# Patient Record
Sex: Female | Born: 1985 | Race: White | Hispanic: No | Marital: Single | Smoking: Never smoker
Health system: Southern US, Community
[De-identification: ages and names within clinical notes are randomized; demographics above are authoritative.]

## PROBLEM LIST (undated history)

## (undated) DIAGNOSIS — Q858 Other phakomatoses, not elsewhere classified: Secondary | ICD-10-CM

## (undated) DIAGNOSIS — Q8589 Other phakomatoses, not elsewhere classified: Secondary | ICD-10-CM

## (undated) DIAGNOSIS — K635 Polyp of colon: Secondary | ICD-10-CM

## (undated) DIAGNOSIS — C801 Malignant (primary) neoplasm, unspecified: Secondary | ICD-10-CM

## (undated) DIAGNOSIS — C50919 Malignant neoplasm of unspecified site of unspecified female breast: Secondary | ICD-10-CM

## (undated) HISTORY — DX: Malignant neoplasm of unspecified site of unspecified female breast: C50.919

## (undated) HISTORY — DX: Polyp of colon: K63.5

## (undated) HISTORY — PX: ABDOMINAL HYSTERECTOMY: SHX81

## (undated) HISTORY — PX: ABDOMINAL SURGERY: SHX537

## (undated) HISTORY — PX: LAPAROSCOPIC OOPHERECTOMY: SHX6507

## (undated) HISTORY — PX: MASTECTOMY: SHX3

---

## 2014-10-01 ENCOUNTER — Emergency Department
Admission: EM | Admit: 2014-10-01 | Discharge: 2014-10-01 | Disposition: A | Discharge status: Home / Self Care | Payer: Self-pay | Source: Home / Self Care | Attending: Emergency Medicine | Admitting: Emergency Medicine

## 2014-10-01 ENCOUNTER — Encounter: Payer: Self-pay | Admitting: *Deleted

## 2014-10-01 DIAGNOSIS — J0100 Acute maxillary sinusitis, unspecified: Secondary | ICD-10-CM

## 2014-10-01 DIAGNOSIS — J209 Acute bronchitis, unspecified: Secondary | ICD-10-CM

## 2014-10-01 HISTORY — DX: Other phakomatoses, not elsewhere classified: Q85.8

## 2014-10-01 HISTORY — DX: Other phakomatoses, not elsewhere classified: Q85.89

## 2014-10-01 MED ORDER — AZITHROMYCIN 250 MG PO TABS: ORAL_TABLET | ORAL | Status: DC

## 2014-10-01 MED ORDER — PROMETHAZINE-CODEINE 6.25-10 MG/5ML PO SYRP: ORAL_SOLUTION | ORAL | Status: DC

## 2014-10-01 MED ORDER — PREDNISONE 20 MG PO TABS: ORAL_TABLET | ORAL | Status: DC

## 2014-10-01 MED ORDER — ALBUTEROL SULFATE HFA 108 (90 BASE) MCG/ACT IN AERS: 2.0000 | INHALATION_SPRAY | RESPIRATORY_TRACT | Status: DC | PRN

## 2014-10-01 NOTE — ED Notes (Signed)
Pt c/o productive cough, nasal congestion, bilateral ear ache and swollen glands in neck x 1 wk. Denies fever. She has taken dayquil and nyquil with some relief.

## 2014-10-01 NOTE — ED Provider Notes (Signed)
CSN: [id number]     Arrival date & time 10/01/14  1729 History   First MD Initiated Contact with Patient 10/01/14 1801     Chief Complaint  Patient presents with  . Cough  . Nasal Congestion   (Consider location/radiation/quality/duration/timing/severity/associated sxs/prior Treatment) HPI Pt c/o productive cough, nasal congestion, bilateral ear ache and swollen glands in neck 9 days. Denies fever. She has taken dayquil and nyquil with some relief.--Chronic meds are Flonase and Zyrtec.  She tends to get URI/bronchitis with the season changes once a year, but is particularly worse this year. Denies history of asthma but had some type of bronchospasm with when necessary inhaler as a child. Other symptoms include low-grade fever, chills and sweats, sinus congestion discolored rhinorrhea, sore throat, hoarseness hacking nonproductive cough that keeps her up at night. Occasional wheezing at night. Occasional wheezing when she is outside in cold air. Past Medical History  Diagnosis Date  . Peutz-Jeghers syndrome    Past Surgical History  Procedure Laterality Date  . Abdominal surgery     Family History  Problem Relation Age of Onset  . Hyperlipidemia Father    History  Substance Use Topics  . Smoking status: Never Smoker   . Smokeless tobacco: Not on file  . Alcohol Use: No   OB History    No data available     Review of Systems  All other systems reviewed and are negative.  LMP 2 weeks ago  Allergies  Aspirin  Home Medications   Prior to Admission medications   Medication Sig Start Date End Date Taking? Authorizing Provider  fluticasone (FLONASE) 50 MCG/ACT nasal spray Place into both nostrils daily.   Yes Historical Provider, MD  ibuprofen (ADVIL,MOTRIN) 200 MG tablet Take 200 mg by mouth every 6 (six) hours as needed.   Yes Historical Provider, MD  albuterol (PROVENTIL HFA;VENTOLIN HFA) 108 (90 BASE) MCG/ACT inhaler Inhale 2 puffs into the lungs every 4 (four) hours  as needed for wheezing or shortness of breath. 10/01/14   Jacqulyn Cane, MD  azithromycin (ZITHROMAX Z-PAK) 250 MG tablet Take 2 tablets on day one, then 1 tablet daily on days 2 through 5 10/01/14   Jacqulyn Cane, MD  predniSONE (DELTASONE) 20 MG tablet Take 3 by mouth today with food, then 1 twice a day with food for 6 more days 10/01/14   Jacqulyn Cane, MD  promethazine-codeine [facility] WITH CODEINE) 6.25-10 MG/5ML syrup Take 1-2 teaspoons before bedtime as needed for cough. May cause drowsiness. 10/01/14   Jacqulyn Cane, MD   BP 119/79 mmHg  Pulse 91  Temp(Src) 98.2 F (36.8 C) (Oral)  Resp 16  Ht 5\' 5"  (1.651 m)  Wt 190 lb (86.183 kg)  BMI 31.62 kg/m2  SpO2 99%  LMP 09/17/2014 Physical Exam  Constitutional: She is oriented to person, place, and time. She appears well-developed and well-nourished. No distress.  Occasional hacking cough noted  HENT:  Head: Normocephalic and atraumatic.  Right Ear: Tympanic membrane, external ear and ear canal normal.  Left Ear: Tympanic membrane, external ear and ear canal normal.  Nose: Mucosal edema and rhinorrhea present. Right sinus exhibits maxillary sinus tenderness. Left sinus exhibits maxillary sinus tenderness.  Mouth/Throat: Oropharynx is clear and moist. No oral lesions. No oropharyngeal exudate.  Eyes: Right eye exhibits no discharge. Left eye exhibits no discharge. No scleral icterus.  Neck: Neck supple.  Cardiovascular: Normal rate, regular rhythm and normal heart sounds.   Pulmonary/Chest: Effort normal. She has wheezes (minimal late expiratory wheezes  anteriorly. Good air movement bilaterally). She has rhonchi. She has no rales.  Lymphadenopathy:    She has no cervical adenopathy.  Neurological: She is alert and oriented to person, place, and time.  Skin: Skin is warm and dry.  Psychiatric: She has a normal mood and affect.  Nursing note and vitals reviewed.   ED Course  Procedures (including critical care time) Labs  Review Labs Reviewed - No data to display  Imaging Review No results found.   MDM   1. Acute bronchitis, unspecified organism   2. Acute maxillary sinusitis, recurrence not specified    Treatment options discussed, as well as risks, benefits, alternatives. Patient voiced understanding and agreement with the following plans:   New Prescriptions   ALBUTEROL (PROVENTIL HFA;VENTOLIN HFA) 108 (90 BASE) MCG/ACT INHALER    Inhale 2 puffs into the lungs every 4 (four) hours as needed for wheezing or shortness of breath.   AZITHROMYCIN (ZITHROMAX Z-PAK) 250 MG TABLET    Take 2 tablets on day one, then 1 tablet daily on days 2 through 5   PREDNISONE (DELTASONE) 20 MG TABLET    Take 3 by mouth today with food, then 1 twice a day with food for 6 more days   PROMETHAZINE-CODEINE (PHENERGAN WITH CODEINE) 6.25-10 MG/5ML SYRUP    Take 1-2 teaspoons before bedtime as needed for cough. May cause drowsiness.   Follow-up with your primary care doctor in 5-7 days if not improving, or sooner if symptoms become worse. Precautions discussed. Red flags discussed. Questions invited and answered. Patient voiced understanding and agreement.    Jacqulyn Cane, MD 10/01/14 [phone]

## 2015-07-23 DIAGNOSIS — C50812 Malignant 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: Secondary | ICD-10-CM | POA: Insufficient documentation

## 2015-12-10 DIAGNOSIS — G629 Polyneuropathy, unspecified: Secondary | ICD-10-CM | POA: Insufficient documentation

## 2017-12-07 DIAGNOSIS — E669 Obesity, unspecified: Secondary | ICD-10-CM | POA: Insufficient documentation

## 2018-04-12 DIAGNOSIS — B029 Zoster without complications: Secondary | ICD-10-CM | POA: Insufficient documentation

## 2018-12-21 DIAGNOSIS — G4710 Hypersomnia, unspecified: Secondary | ICD-10-CM | POA: Insufficient documentation

## 2019-05-03 DIAGNOSIS — R000 Tachycardia, unspecified: Secondary | ICD-10-CM | POA: Insufficient documentation

## 2019-05-22 DIAGNOSIS — R519 Headache, unspecified: Secondary | ICD-10-CM | POA: Insufficient documentation

## 2019-07-11 DIAGNOSIS — R9089 Other abnormal findings on diagnostic imaging of central nervous system: Secondary | ICD-10-CM | POA: Insufficient documentation

## 2020-10-06 DIAGNOSIS — R2689 Other abnormalities of gait and mobility: Secondary | ICD-10-CM | POA: Diagnosis not present

## 2020-10-06 DIAGNOSIS — R519 Headache, unspecified: Secondary | ICD-10-CM | POA: Diagnosis not present

## 2020-10-06 DIAGNOSIS — H93293 Other abnormal auditory perceptions, bilateral: Secondary | ICD-10-CM | POA: Diagnosis not present

## 2020-10-06 DIAGNOSIS — H6983 Other specified disorders of Eustachian tube, bilateral: Secondary | ICD-10-CM | POA: Diagnosis not present

## 2020-12-08 DIAGNOSIS — D229 Melanocytic nevi, unspecified: Secondary | ICD-10-CM | POA: Diagnosis not present

## 2020-12-08 DIAGNOSIS — L814 Other melanin hyperpigmentation: Secondary | ICD-10-CM | POA: Diagnosis not present

## 2020-12-08 DIAGNOSIS — L308 Other specified dermatitis: Secondary | ICD-10-CM | POA: Diagnosis not present

## 2020-12-09 DIAGNOSIS — Z9012 Acquired absence of left breast and nipple: Secondary | ICD-10-CM | POA: Diagnosis not present

## 2020-12-09 DIAGNOSIS — M899 Disorder of bone, unspecified: Secondary | ICD-10-CM | POA: Diagnosis not present

## 2020-12-09 DIAGNOSIS — Z9221 Personal history of antineoplastic chemotherapy: Secondary | ICD-10-CM | POA: Diagnosis not present

## 2020-12-09 DIAGNOSIS — C50312 Malignant neoplasm of lower-inner quadrant of left female breast: Secondary | ICD-10-CM | POA: Diagnosis not present

## 2020-12-09 DIAGNOSIS — Z79811 Long term (current) use of aromatase inhibitors: Secondary | ICD-10-CM | POA: Diagnosis not present

## 2020-12-09 DIAGNOSIS — Z6838 Body mass index (BMI) 38.0-38.9, adult: Secondary | ICD-10-CM | POA: Diagnosis not present

## 2020-12-09 DIAGNOSIS — C50912 Malignant neoplasm of unspecified site of left female breast: Secondary | ICD-10-CM | POA: Diagnosis not present

## 2020-12-09 DIAGNOSIS — E041 Nontoxic single thyroid nodule: Secondary | ICD-10-CM | POA: Diagnosis not present

## 2020-12-09 DIAGNOSIS — C773 Secondary and unspecified malignant neoplasm of axilla and upper limb lymph nodes: Secondary | ICD-10-CM | POA: Diagnosis not present

## 2020-12-09 DIAGNOSIS — Z170 Estrogen receptor positive status [ER+]: Secondary | ICD-10-CM | POA: Diagnosis not present

## 2020-12-09 DIAGNOSIS — Z923 Personal history of irradiation: Secondary | ICD-10-CM | POA: Diagnosis not present

## 2020-12-09 DIAGNOSIS — C50812 Malignant 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: Secondary | ICD-10-CM | POA: Diagnosis not present

## 2020-12-09 DIAGNOSIS — Z90722 Acquired absence of ovaries, bilateral: Secondary | ICD-10-CM | POA: Diagnosis not present

## 2020-12-09 DIAGNOSIS — M810 Age-related osteoporosis without current pathological fracture: Secondary | ICD-10-CM | POA: Diagnosis not present

## 2020-12-09 DIAGNOSIS — N289 Disorder of kidney and ureter, unspecified: Secondary | ICD-10-CM | POA: Diagnosis not present

## 2020-12-09 DIAGNOSIS — R635 Abnormal weight gain: Secondary | ICD-10-CM | POA: Diagnosis not present

## 2021-01-05 DIAGNOSIS — Q858 Other phakomatoses, not elsewhere classified: Secondary | ICD-10-CM | POA: Diagnosis not present

## 2021-01-06 DIAGNOSIS — Q858 Other phakomatoses, not elsewhere classified: Secondary | ICD-10-CM | POA: Diagnosis not present

## 2021-01-06 DIAGNOSIS — C50919 Malignant neoplasm of unspecified site of unspecified female breast: Secondary | ICD-10-CM | POA: Diagnosis not present

## 2021-01-25 DIAGNOSIS — M7989 Other specified soft tissue disorders: Secondary | ICD-10-CM | POA: Diagnosis not present

## 2021-01-25 DIAGNOSIS — M25571 Pain in right ankle and joints of right foot: Secondary | ICD-10-CM | POA: Diagnosis not present

## 2021-01-25 DIAGNOSIS — S92351A Displaced fracture of fifth metatarsal bone, right foot, initial encounter for closed fracture: Secondary | ICD-10-CM | POA: Diagnosis not present

## 2021-01-26 DIAGNOSIS — S92351A Displaced fracture of fifth metatarsal bone, right foot, initial encounter for closed fracture: Secondary | ICD-10-CM | POA: Diagnosis not present

## 2021-01-31 DIAGNOSIS — S92811A Other fracture of right foot, initial encounter for closed fracture: Secondary | ICD-10-CM | POA: Diagnosis not present

## 2021-02-16 DIAGNOSIS — Z881 Allergy status to other antibiotic agents status: Secondary | ICD-10-CM | POA: Diagnosis not present

## 2021-02-16 DIAGNOSIS — Z882 Allergy status to sulfonamides status: Secondary | ICD-10-CM | POA: Diagnosis not present

## 2021-02-16 DIAGNOSIS — S92351D Displaced fracture of fifth metatarsal bone, right foot, subsequent encounter for fracture with routine healing: Secondary | ICD-10-CM | POA: Diagnosis not present

## 2021-02-16 DIAGNOSIS — X501XXD Overexertion from prolonged static or awkward postures, subsequent encounter: Secondary | ICD-10-CM | POA: Diagnosis not present

## 2021-02-16 DIAGNOSIS — W19XXXD Unspecified fall, subsequent encounter: Secondary | ICD-10-CM | POA: Diagnosis not present

## 2021-02-16 DIAGNOSIS — M25571 Pain in right ankle and joints of right foot: Secondary | ICD-10-CM | POA: Diagnosis not present

## 2021-02-16 DIAGNOSIS — Z886 Allergy status to analgesic agent status: Secondary | ICD-10-CM | POA: Diagnosis not present

## 2021-02-16 DIAGNOSIS — S92351A Displaced fracture of fifth metatarsal bone, right foot, initial encounter for closed fracture: Secondary | ICD-10-CM | POA: Diagnosis not present

## 2021-02-18 DIAGNOSIS — S92211A Displaced fracture of cuboid bone of right foot, initial encounter for closed fracture: Secondary | ICD-10-CM | POA: Diagnosis not present

## 2021-02-18 DIAGNOSIS — S82841A Displaced bimalleolar fracture of right lower leg, initial encounter for closed fracture: Secondary | ICD-10-CM | POA: Diagnosis not present

## 2021-02-18 DIAGNOSIS — S92351D Displaced fracture of fifth metatarsal bone, right foot, subsequent encounter for fracture with routine healing: Secondary | ICD-10-CM | POA: Diagnosis not present

## 2021-02-18 DIAGNOSIS — S92021A Displaced fracture of anterior process of right calcaneus, initial encounter for closed fracture: Secondary | ICD-10-CM | POA: Diagnosis not present

## 2021-02-27 DIAGNOSIS — G90521 Complex regional pain syndrome I of right lower limb: Secondary | ICD-10-CM | POA: Diagnosis not present

## 2021-02-27 DIAGNOSIS — S92021A Displaced fracture of anterior process of right calcaneus, initial encounter for closed fracture: Secondary | ICD-10-CM | POA: Diagnosis not present

## 2021-02-27 DIAGNOSIS — S8251XA Displaced fracture of medial malleolus of right tibia, initial encounter for closed fracture: Secondary | ICD-10-CM | POA: Diagnosis not present

## 2021-02-27 DIAGNOSIS — S8261XA Displaced fracture of lateral malleolus of right fibula, initial encounter for closed fracture: Secondary | ICD-10-CM | POA: Diagnosis not present

## 2021-03-27 DIAGNOSIS — R262 Difficulty in walking, not elsewhere classified: Secondary | ICD-10-CM | POA: Diagnosis not present

## 2021-03-27 DIAGNOSIS — S92021A Displaced fracture of anterior process of right calcaneus, initial encounter for closed fracture: Secondary | ICD-10-CM | POA: Diagnosis not present

## 2021-03-27 DIAGNOSIS — M25571 Pain in right ankle and joints of right foot: Secondary | ICD-10-CM | POA: Diagnosis not present

## 2021-03-27 DIAGNOSIS — M25671 Stiffness of right ankle, not elsewhere classified: Secondary | ICD-10-CM | POA: Diagnosis not present

## 2021-03-27 DIAGNOSIS — X58XXXA Exposure to other specified factors, initial encounter: Secondary | ICD-10-CM | POA: Diagnosis not present

## 2021-03-27 DIAGNOSIS — S92351G Displaced fracture of fifth metatarsal bone, right foot, subsequent encounter for fracture with delayed healing: Secondary | ICD-10-CM | POA: Diagnosis not present

## 2021-03-27 DIAGNOSIS — S8251XA Displaced fracture of medial malleolus of right tibia, initial encounter for closed fracture: Secondary | ICD-10-CM | POA: Diagnosis not present

## 2021-03-27 DIAGNOSIS — S92211A Displaced fracture of cuboid bone of right foot, initial encounter for closed fracture: Secondary | ICD-10-CM | POA: Diagnosis not present

## 2021-03-27 DIAGNOSIS — X58XXXD Exposure to other specified factors, subsequent encounter: Secondary | ICD-10-CM | POA: Diagnosis not present

## 2021-03-27 DIAGNOSIS — S8261XA Displaced fracture of lateral malleolus of right fibula, initial encounter for closed fracture: Secondary | ICD-10-CM | POA: Diagnosis not present

## 2021-03-27 DIAGNOSIS — G90521 Complex regional pain syndrome I of right lower limb: Secondary | ICD-10-CM | POA: Diagnosis not present

## 2021-04-01 DIAGNOSIS — S8251XA Displaced fracture of medial malleolus of right tibia, initial encounter for closed fracture: Secondary | ICD-10-CM | POA: Diagnosis not present

## 2021-04-01 DIAGNOSIS — X58XXXA Exposure to other specified factors, initial encounter: Secondary | ICD-10-CM | POA: Diagnosis not present

## 2021-04-01 DIAGNOSIS — S92211A Displaced fracture of cuboid bone of right foot, initial encounter for closed fracture: Secondary | ICD-10-CM | POA: Diagnosis not present

## 2021-04-01 DIAGNOSIS — M25671 Stiffness of right ankle, not elsewhere classified: Secondary | ICD-10-CM | POA: Diagnosis not present

## 2021-04-01 DIAGNOSIS — R262 Difficulty in walking, not elsewhere classified: Secondary | ICD-10-CM | POA: Diagnosis not present

## 2021-04-01 DIAGNOSIS — G90521 Complex regional pain syndrome I of right lower limb: Secondary | ICD-10-CM | POA: Diagnosis not present

## 2021-04-01 DIAGNOSIS — S8261XA Displaced fracture of lateral malleolus of right fibula, initial encounter for closed fracture: Secondary | ICD-10-CM | POA: Diagnosis not present

## 2021-04-01 DIAGNOSIS — S92021A Displaced fracture of anterior process of right calcaneus, initial encounter for closed fracture: Secondary | ICD-10-CM | POA: Diagnosis not present

## 2021-04-01 DIAGNOSIS — M25571 Pain in right ankle and joints of right foot: Secondary | ICD-10-CM | POA: Diagnosis not present

## 2021-04-01 DIAGNOSIS — S92351G Displaced fracture of fifth metatarsal bone, right foot, subsequent encounter for fracture with delayed healing: Secondary | ICD-10-CM | POA: Diagnosis not present

## 2021-04-01 DIAGNOSIS — X58XXXD Exposure to other specified factors, subsequent encounter: Secondary | ICD-10-CM | POA: Diagnosis not present

## 2021-04-06 DIAGNOSIS — M7751 Other enthesopathy of right foot: Secondary | ICD-10-CM | POA: Diagnosis not present

## 2021-04-06 DIAGNOSIS — S8261XD Displaced fracture of lateral malleolus of right fibula, subsequent encounter for closed fracture with routine healing: Secondary | ICD-10-CM | POA: Diagnosis not present

## 2021-04-06 DIAGNOSIS — S92021D Displaced fracture of anterior process of right calcaneus, subsequent encounter for fracture with routine healing: Secondary | ICD-10-CM | POA: Diagnosis not present

## 2021-04-06 DIAGNOSIS — S8251XD Displaced fracture of medial malleolus of right tibia, subsequent encounter for closed fracture with routine healing: Secondary | ICD-10-CM | POA: Diagnosis not present

## 2021-04-15 DIAGNOSIS — S8251XA Displaced fracture of medial malleolus of right tibia, initial encounter for closed fracture: Secondary | ICD-10-CM | POA: Diagnosis not present

## 2021-04-15 DIAGNOSIS — S92021A Displaced fracture of anterior process of right calcaneus, initial encounter for closed fracture: Secondary | ICD-10-CM | POA: Diagnosis not present

## 2021-04-15 DIAGNOSIS — X58XXXD Exposure to other specified factors, subsequent encounter: Secondary | ICD-10-CM | POA: Diagnosis not present

## 2021-04-15 DIAGNOSIS — M25571 Pain in right ankle and joints of right foot: Secondary | ICD-10-CM | POA: Diagnosis not present

## 2021-04-15 DIAGNOSIS — S92351G Displaced fracture of fifth metatarsal bone, right foot, subsequent encounter for fracture with delayed healing: Secondary | ICD-10-CM | POA: Diagnosis not present

## 2021-04-15 DIAGNOSIS — R262 Difficulty in walking, not elsewhere classified: Secondary | ICD-10-CM | POA: Diagnosis not present

## 2021-04-15 DIAGNOSIS — X58XXXA Exposure to other specified factors, initial encounter: Secondary | ICD-10-CM | POA: Diagnosis not present

## 2021-04-15 DIAGNOSIS — M25671 Stiffness of right ankle, not elsewhere classified: Secondary | ICD-10-CM | POA: Diagnosis not present

## 2021-04-15 DIAGNOSIS — S8261XA Displaced fracture of lateral malleolus of right fibula, initial encounter for closed fracture: Secondary | ICD-10-CM | POA: Diagnosis not present

## 2021-04-15 DIAGNOSIS — G90521 Complex regional pain syndrome I of right lower limb: Secondary | ICD-10-CM | POA: Diagnosis not present

## 2021-04-15 DIAGNOSIS — S92211A Displaced fracture of cuboid bone of right foot, initial encounter for closed fracture: Secondary | ICD-10-CM | POA: Diagnosis not present

## 2021-04-22 DIAGNOSIS — R262 Difficulty in walking, not elsewhere classified: Secondary | ICD-10-CM | POA: Diagnosis not present

## 2021-04-22 DIAGNOSIS — M25671 Stiffness of right ankle, not elsewhere classified: Secondary | ICD-10-CM | POA: Diagnosis not present

## 2021-04-22 DIAGNOSIS — S8251XA Displaced fracture of medial malleolus of right tibia, initial encounter for closed fracture: Secondary | ICD-10-CM | POA: Diagnosis not present

## 2021-04-22 DIAGNOSIS — G9050 Complex regional pain syndrome I, unspecified: Secondary | ICD-10-CM | POA: Diagnosis not present

## 2021-04-22 DIAGNOSIS — M25571 Pain in right ankle and joints of right foot: Secondary | ICD-10-CM | POA: Diagnosis not present

## 2021-04-22 DIAGNOSIS — X58XXXA Exposure to other specified factors, initial encounter: Secondary | ICD-10-CM | POA: Diagnosis not present

## 2021-04-22 DIAGNOSIS — X58XXXD Exposure to other specified factors, subsequent encounter: Secondary | ICD-10-CM | POA: Diagnosis not present

## 2021-04-30 DIAGNOSIS — X58XXXD Exposure to other specified factors, subsequent encounter: Secondary | ICD-10-CM | POA: Diagnosis not present

## 2021-04-30 DIAGNOSIS — X58XXXA Exposure to other specified factors, initial encounter: Secondary | ICD-10-CM | POA: Diagnosis not present

## 2021-04-30 DIAGNOSIS — G9050 Complex regional pain syndrome I, unspecified: Secondary | ICD-10-CM | POA: Diagnosis not present

## 2021-04-30 DIAGNOSIS — R262 Difficulty in walking, not elsewhere classified: Secondary | ICD-10-CM | POA: Diagnosis not present

## 2021-04-30 DIAGNOSIS — M25571 Pain in right ankle and joints of right foot: Secondary | ICD-10-CM | POA: Diagnosis not present

## 2021-04-30 DIAGNOSIS — S8251XA Displaced fracture of medial malleolus of right tibia, initial encounter for closed fracture: Secondary | ICD-10-CM | POA: Diagnosis not present

## 2021-04-30 DIAGNOSIS — M25671 Stiffness of right ankle, not elsewhere classified: Secondary | ICD-10-CM | POA: Diagnosis not present

## 2021-05-04 DIAGNOSIS — S92021D Displaced fracture of anterior process of right calcaneus, subsequent encounter for fracture with routine healing: Secondary | ICD-10-CM | POA: Diagnosis not present

## 2021-05-04 DIAGNOSIS — S82841D Displaced bimalleolar fracture of right lower leg, subsequent encounter for closed fracture with routine healing: Secondary | ICD-10-CM | POA: Diagnosis not present

## 2021-05-04 DIAGNOSIS — S92351D Displaced fracture of fifth metatarsal bone, right foot, subsequent encounter for fracture with routine healing: Secondary | ICD-10-CM | POA: Diagnosis not present

## 2021-05-04 DIAGNOSIS — S92211D Displaced fracture of cuboid bone of right foot, subsequent encounter for fracture with routine healing: Secondary | ICD-10-CM | POA: Diagnosis not present

## 2021-05-07 DIAGNOSIS — X58XXXD Exposure to other specified factors, subsequent encounter: Secondary | ICD-10-CM | POA: Diagnosis not present

## 2021-05-07 DIAGNOSIS — M25571 Pain in right ankle and joints of right foot: Secondary | ICD-10-CM | POA: Diagnosis not present

## 2021-05-07 DIAGNOSIS — S8251XA Displaced fracture of medial malleolus of right tibia, initial encounter for closed fracture: Secondary | ICD-10-CM | POA: Diagnosis not present

## 2021-05-07 DIAGNOSIS — R262 Difficulty in walking, not elsewhere classified: Secondary | ICD-10-CM | POA: Diagnosis not present

## 2021-05-07 DIAGNOSIS — G9050 Complex regional pain syndrome I, unspecified: Secondary | ICD-10-CM | POA: Diagnosis not present

## 2021-05-07 DIAGNOSIS — X58XXXA Exposure to other specified factors, initial encounter: Secondary | ICD-10-CM | POA: Diagnosis not present

## 2021-05-07 DIAGNOSIS — M25671 Stiffness of right ankle, not elsewhere classified: Secondary | ICD-10-CM | POA: Diagnosis not present

## 2021-05-11 DIAGNOSIS — R262 Difficulty in walking, not elsewhere classified: Secondary | ICD-10-CM | POA: Diagnosis not present

## 2021-05-11 DIAGNOSIS — X58XXXA Exposure to other specified factors, initial encounter: Secondary | ICD-10-CM | POA: Diagnosis not present

## 2021-05-11 DIAGNOSIS — G9050 Complex regional pain syndrome I, unspecified: Secondary | ICD-10-CM | POA: Diagnosis not present

## 2021-05-11 DIAGNOSIS — S8251XA Displaced fracture of medial malleolus of right tibia, initial encounter for closed fracture: Secondary | ICD-10-CM | POA: Diagnosis not present

## 2021-05-11 DIAGNOSIS — M25671 Stiffness of right ankle, not elsewhere classified: Secondary | ICD-10-CM | POA: Diagnosis not present

## 2021-05-11 DIAGNOSIS — M25571 Pain in right ankle and joints of right foot: Secondary | ICD-10-CM | POA: Diagnosis not present

## 2021-05-11 DIAGNOSIS — X58XXXD Exposure to other specified factors, subsequent encounter: Secondary | ICD-10-CM | POA: Diagnosis not present

## 2021-06-05 DIAGNOSIS — S92021D Displaced fracture of anterior process of right calcaneus, subsequent encounter for fracture with routine healing: Secondary | ICD-10-CM | POA: Diagnosis not present

## 2021-06-05 DIAGNOSIS — S8261XD Displaced fracture of lateral malleolus of right fibula, subsequent encounter for closed fracture with routine healing: Secondary | ICD-10-CM | POA: Diagnosis not present

## 2021-06-05 DIAGNOSIS — S8251XD Displaced fracture of medial malleolus of right tibia, subsequent encounter for closed fracture with routine healing: Secondary | ICD-10-CM | POA: Diagnosis not present

## 2021-06-05 DIAGNOSIS — G90521 Complex regional pain syndrome I of right lower limb: Secondary | ICD-10-CM | POA: Diagnosis not present

## 2021-07-23 DIAGNOSIS — R262 Difficulty in walking, not elsewhere classified: Secondary | ICD-10-CM | POA: Diagnosis not present

## 2021-07-23 DIAGNOSIS — M25571 Pain in right ankle and joints of right foot: Secondary | ICD-10-CM | POA: Diagnosis not present

## 2021-07-23 DIAGNOSIS — W108XXD Fall (on) (from) other stairs and steps, subsequent encounter: Secondary | ICD-10-CM | POA: Diagnosis not present

## 2021-07-23 DIAGNOSIS — Y9301 Activity, walking, marching and hiking: Secondary | ICD-10-CM | POA: Diagnosis not present

## 2021-07-23 DIAGNOSIS — M25671 Stiffness of right ankle, not elsewhere classified: Secondary | ICD-10-CM | POA: Diagnosis not present

## 2021-07-23 DIAGNOSIS — S8261XD Displaced fracture of lateral malleolus of right fibula, subsequent encounter for closed fracture with routine healing: Secondary | ICD-10-CM | POA: Diagnosis not present

## 2021-07-30 DIAGNOSIS — W108XXD Fall (on) (from) other stairs and steps, subsequent encounter: Secondary | ICD-10-CM | POA: Diagnosis not present

## 2021-07-30 DIAGNOSIS — S8261XD Displaced fracture of lateral malleolus of right fibula, subsequent encounter for closed fracture with routine healing: Secondary | ICD-10-CM | POA: Diagnosis not present

## 2021-07-30 DIAGNOSIS — Y9301 Activity, walking, marching and hiking: Secondary | ICD-10-CM | POA: Diagnosis not present

## 2021-07-30 DIAGNOSIS — R262 Difficulty in walking, not elsewhere classified: Secondary | ICD-10-CM | POA: Diagnosis not present

## 2021-07-30 DIAGNOSIS — M25671 Stiffness of right ankle, not elsewhere classified: Secondary | ICD-10-CM | POA: Diagnosis not present

## 2021-07-30 DIAGNOSIS — M25571 Pain in right ankle and joints of right foot: Secondary | ICD-10-CM | POA: Diagnosis not present

## 2021-08-06 DIAGNOSIS — W108XXD Fall (on) (from) other stairs and steps, subsequent encounter: Secondary | ICD-10-CM | POA: Diagnosis not present

## 2021-08-06 DIAGNOSIS — R262 Difficulty in walking, not elsewhere classified: Secondary | ICD-10-CM | POA: Diagnosis not present

## 2021-08-06 DIAGNOSIS — S8261XD Displaced fracture of lateral malleolus of right fibula, subsequent encounter for closed fracture with routine healing: Secondary | ICD-10-CM | POA: Diagnosis not present

## 2021-08-06 DIAGNOSIS — M25671 Stiffness of right ankle, not elsewhere classified: Secondary | ICD-10-CM | POA: Diagnosis not present

## 2021-08-06 DIAGNOSIS — Y9301 Activity, walking, marching and hiking: Secondary | ICD-10-CM | POA: Diagnosis not present

## 2021-08-06 DIAGNOSIS — M25571 Pain in right ankle and joints of right foot: Secondary | ICD-10-CM | POA: Diagnosis not present

## 2021-08-21 DIAGNOSIS — X58XXXA Exposure to other specified factors, initial encounter: Secondary | ICD-10-CM | POA: Diagnosis not present

## 2021-08-21 DIAGNOSIS — M25671 Stiffness of right ankle, not elsewhere classified: Secondary | ICD-10-CM | POA: Diagnosis not present

## 2021-08-21 DIAGNOSIS — M25571 Pain in right ankle and joints of right foot: Secondary | ICD-10-CM | POA: Diagnosis not present

## 2021-08-21 DIAGNOSIS — R262 Difficulty in walking, not elsewhere classified: Secondary | ICD-10-CM | POA: Diagnosis not present

## 2021-08-21 DIAGNOSIS — S8261XD Displaced fracture of lateral malleolus of right fibula, subsequent encounter for closed fracture with routine healing: Secondary | ICD-10-CM | POA: Diagnosis not present

## 2021-08-21 DIAGNOSIS — X58XXXD Exposure to other specified factors, subsequent encounter: Secondary | ICD-10-CM | POA: Diagnosis not present

## 2021-09-15 DIAGNOSIS — S8261XD Displaced fracture of lateral malleolus of right fibula, subsequent encounter for closed fracture with routine healing: Secondary | ICD-10-CM | POA: Diagnosis not present

## 2021-09-15 DIAGNOSIS — X58XXXD Exposure to other specified factors, subsequent encounter: Secondary | ICD-10-CM | POA: Diagnosis not present

## 2021-09-15 DIAGNOSIS — X58XXXA Exposure to other specified factors, initial encounter: Secondary | ICD-10-CM | POA: Diagnosis not present

## 2021-09-15 DIAGNOSIS — R262 Difficulty in walking, not elsewhere classified: Secondary | ICD-10-CM | POA: Diagnosis not present

## 2021-09-15 DIAGNOSIS — M25571 Pain in right ankle and joints of right foot: Secondary | ICD-10-CM | POA: Diagnosis not present

## 2021-09-15 DIAGNOSIS — M25671 Stiffness of right ankle, not elsewhere classified: Secondary | ICD-10-CM | POA: Diagnosis not present

## 2021-10-22 ENCOUNTER — Other Ambulatory Visit: Payer: Self-pay

## 2021-10-22 ENCOUNTER — Emergency Department
Admission: EM | Admit: 2021-10-22 | Discharge: 2021-10-22 | Disposition: A | Discharge status: Home / Self Care | Payer: 59 | Source: Home / Self Care

## 2021-10-22 DIAGNOSIS — J0100 Acute maxillary sinusitis, unspecified: Secondary | ICD-10-CM

## 2021-10-22 DIAGNOSIS — K120 Recurrent oral aphthae: Secondary | ICD-10-CM

## 2021-10-22 DIAGNOSIS — R059 Cough, unspecified: Secondary | ICD-10-CM

## 2021-10-22 DIAGNOSIS — J309 Allergic rhinitis, unspecified: Secondary | ICD-10-CM

## 2021-10-22 HISTORY — DX: Malignant (primary) neoplasm, unspecified: C80.1

## 2021-10-22 MED ORDER — DOXYCYCLINE HYCLATE 100 MG PO CAPS: 100.0000 mg | ORAL_CAPSULE | Freq: Two times a day (BID) | ORAL | 0 refills | Status: AC

## 2021-10-22 MED ORDER — BENZONATATE 200 MG PO CAPS: 200.0000 mg | ORAL_CAPSULE | Freq: Three times a day (TID) | ORAL | 0 refills | Status: AC | PRN

## 2021-10-22 MED ORDER — FEXOFENADINE HCL 180 MG PO TABS: 180.0000 mg | ORAL_TABLET | Freq: Every day | ORAL | 0 refills | Status: DC

## 2021-10-22 MED ORDER — CHLORHEXIDINE GLUCONATE 0.12 % MT SOLN: 15.0000 mL | Freq: Two times a day (BID) | OROMUCOSAL | 0 refills | Status: DC

## 2021-10-22 NOTE — ED Provider Notes (Signed)
[name]    CSN: [id number] Arrival date & time: 10/22/21  1239      History   Chief Complaint Chief Complaint  Patient presents with   Cough   Blisters    In mouth    HPI Kim Simpson is a 36 y.o. female.   HPI 36 year old female presents with cough and congestion for 3 to 4 days.  Was evaluated at urgent care in [facility] yesterday with a negative flu negative COVID.  Reports peeling of lips earlier this morning with blisters inside her mouth.  Reports nephew had hand-foot-and-mouth disease over Christmas.  Past Medical History:  Diagnosis Date   Cancer [facility])    Peutz-Jeghers syndrome     There are no problems to display for this patient.   Past Surgical History:  Procedure Laterality Date   ABDOMINAL SURGERY     MASTECTOMY      OB History   No obstetric history on file.      Home Medications    Prior to Admission medications   Medication Sig Start Date End Date Taking? Authorizing Provider  benzonatate (TESSALON) 200 MG capsule Take 1 capsule (200 mg total) by mouth 3 (three) times daily as needed for up to 7 days for cough. 10/22/21 10/29/21 Yes Eliezer Lofts, FNP  chlorhexidine (PERIDEX) 0.12 % solution Use as directed 15 mLs in the mouth or throat 2 (two) times daily. 10/22/21  Yes Eliezer Lofts, FNP  doxycycline (VIBRAMYCIN) 100 MG capsule Take 1 capsule (100 mg total) by mouth 2 (two) times daily for 7 days. 10/22/21 10/29/21 Yes Eliezer Lofts, FNP  fexofenadine [facility] ALLERGY) 180 MG tablet Take 1 tablet (180 mg total) by mouth daily for 15 days. 10/22/21 11/06/21 Yes Eliezer Lofts, FNP  albuterol (PROVENTIL HFA;VENTOLIN HFA) 108 (90 BASE) MCG/ACT inhaler Inhale 2 puffs into the lungs every 4 (four) hours as needed for wheezing or shortness of breath. 10/01/14   Jacqulyn Cane, MD  fluticasone [facility]) 50 MCG/ACT nasal spray Place into both nostrils daily.    [provider]  ibuprofen (ADVIL,MOTRIN) 200 MG tablet Take 200 mg by mouth  every 6 (six) hours as needed.    [provider]  cetirizine (ZYRTEC) 10 MG tablet Take 10 mg by mouth daily.  10/01/14  [provider]    Family History Family History  Problem Relation Age of Onset   Hyperlipidemia Father     Social History Social History   Tobacco Use   Smoking status: Never  Substance Use Topics   Alcohol use: No   Drug use: No     Allergies   Cephalexin, Sulfamethoxazole-trimethoprim, and Aspirin   Review of Systems Review of Systems  HENT:         Blisters of mouth x days  Respiratory:  Positive for cough.   All other systems reviewed and are negative.   Physical Exam Triage Vital Signs ED Triage Vitals [10/22/21 1255]  Enc Vitals Group     BP 120/86     Pulse Rate (!) 103     Resp 18     Temp 98.5 F (36.9 C)     Temp Source Oral     SpO2 99 %     Weight      Height      Head Circumference      Peak Flow      Pain Score 2     Pain Loc      Pain Edu?  Excl. in [place]?    No data found.  Updated Vital Signs BP 120/86 (BP Location: Right Arm)    Pulse (!) 103    Temp 98.5 F (36.9 C) (Oral)    Resp 18    SpO2 99%   Physical Exam Vitals and nursing note reviewed.  Constitutional:      General: She is not in acute distress.    Appearance: She is obese. She is not ill-appearing.  HENT:     Right Ear: Tympanic membrane and external ear normal.     Left Ear: Tympanic membrane and external ear normal.     Ears:     Comments: Moderate eustachian tube dysfunction noted bilaterally    Nose:     Comments: Turbinates are erythematous/edematous    Mouth/Throat:     Mouth: Mucous membranes are moist.     Pharynx: Oropharynx is clear.     Comments: Moderate amount of clear drainage of posterior oropharynx noted, tattered multiple aphthous ulcers noted of bilateral gingival mucosa. Eyes:     Extraocular Movements: Extraocular movements intact.     Conjunctiva/sclera: Conjunctivae normal.     Pupils: Pupils are  equal, round, and reactive to light.  Cardiovascular:     Rate and Rhythm: Normal rate and regular rhythm.     Pulses: Normal pulses.     Heart sounds: Normal heart sounds.  Pulmonary:     Effort: Pulmonary effort is normal.     Breath sounds: Normal breath sounds.  Musculoskeletal:     Cervical back: Neck supple. No tenderness.  Lymphadenopathy:     Cervical: No cervical adenopathy.  Skin:    General: Skin is warm and dry.  Neurological:     General: No focal deficit present.     Mental Status: She is alert and oriented to person, place, and time.     UC Treatments / Results  Labs (all labs ordered are listed, but only abnormal results are displayed) Labs Reviewed - No data to display  EKG   Radiology No results found.  Procedures Procedures (including critical care time)  Medications Ordered in UC Medications - No data to display  Initial Impression / Assessment and Plan / UC Course  I have reviewed the triage vital signs and the nursing notes.  Pertinent labs & imaging results that were available during my care of the patient were reviewed by me and considered in my medical decision making (see chart for details).     MDM: 1.  Acute maxillary sinusitis-Rx Doxycycline; 2. Allergic rhinitis-Rx'd Allegra; 3.  Aphthous ulcer of mouth-Rx'd Peridex; 4.  Cough-Tessalon Perles. Advised patient to take medication as directed with food to completion.  Advised patient to take Allegra with first dose of Doxycycline for the next 5 to 7 days.  Afterwards may use Allegra as needed for concurrent postnasal drip/drainage.  Advised patient to use Peridex as directed for oral/aphthous ulcers of mouth.  May useTessalon Perles daily or as needed for cough. Encouraged patient to increase daily water intake while taking these medications.  Discharged home, hemodynamically stable. Final Clinical Impressions(s) / UC Diagnoses   Final diagnoses:  Subacute maxillary sinusitis  Allergic  rhinitis, unspecified seasonality, unspecified trigger  Aphthous ulcer of mouth  Cough, unspecified type     Discharge Instructions      Advised patient to take medication as directed with food to completion.  Advised patient to take Allegra with first dose of Doxycycline for the next 5 to 7 days.  Afterwards may use Allegra as needed for concurrent postnasal drip/drainage.  Advised patient to use Peridex as directed for oral/aphthous ulcers of mouth.  May useTessalon Perles daily or as needed for cough. Encouraged patient to increase daily water intake while taking these medications.     ED Prescriptions     Medication Sig Dispense Auth. Provider   doxycycline (VIBRAMYCIN) 100 MG capsule Take 1 capsule (100 mg total) by mouth 2 (two) times daily for 7 days. 14 capsule Eliezer Lofts, FNP   fexofenadine [facility] ALLERGY) 180 MG tablet Take 1 tablet (180 mg total) by mouth daily for 15 days. 15 tablet Eliezer Lofts, FNP   chlorhexidine (PERIDEX) 0.12 % solution Use as directed 15 mLs in the mouth or throat 2 (two) times daily. 120 mL Eliezer Lofts, FNP   benzonatate (TESSALON) 200 MG capsule Take 1 capsule (200 mg total) by mouth 3 (three) times daily as needed for up to 7 days for cough. 40 capsule Eliezer Lofts, FNP      PDMP not reviewed this encounter.   Eliezer Lofts, [place] 10/22/21 1343

## 2021-10-22 NOTE — ED Triage Notes (Signed)
Pt c/o cold sxs that started over the weekend. Was seen at another UC yesterday. Flu and covid neg. Noticed lips started peeling this am and some blisters on inside of mouth. States nephew had hand foot and mouth over Christmas. Taking dayquil/nyquil prn.

## 2021-10-22 NOTE — Discharge Instructions (Addendum)
Advised patient to take medication as directed with food to completion.  Advised patient to take Allegra with first dose of Doxycycline for the next 5 to 7 days.  Afterwards may use Allegra as needed for concurrent postnasal drip/drainage.  Advised patient to use Peridex as directed for oral/aphthous ulcers of mouth.  May useTessalon Perles daily or as needed for cough. Encouraged patient to increase daily water intake while taking these medications.

## 2021-11-26 ENCOUNTER — Ambulatory Visit: Payer: 59 | Admitting: Podiatry

## 2021-12-03 ENCOUNTER — Ambulatory Visit: Payer: 59 | Admitting: Podiatry

## 2021-12-03 ENCOUNTER — Other Ambulatory Visit: Payer: Self-pay

## 2021-12-03 ENCOUNTER — Ambulatory Visit (INDEPENDENT_AMBULATORY_CARE_PROVIDER_SITE_OTHER): Payer: 59

## 2021-12-03 DIAGNOSIS — M779 Enthesopathy, unspecified: Secondary | ICD-10-CM

## 2021-12-03 DIAGNOSIS — M7751 Other enthesopathy of right foot: Secondary | ICD-10-CM

## 2021-12-03 DIAGNOSIS — S99921A Unspecified injury of right foot, initial encounter: Secondary | ICD-10-CM

## 2021-12-03 MED ORDER — TRIAMCINOLONE ACETONIDE 10 MG/ML IJ SUSP
10.0000 mg | Freq: Once | INTRAMUSCULAR | Status: AC
  Administered 2021-12-03: 10 mg

## 2021-12-03 MED ORDER — DICLOFENAC SODIUM 75 MG PO TBEC: 75.0000 mg | DELAYED_RELEASE_TABLET | Freq: Two times a day (BID) | ORAL | 2 refills | Status: DC

## 2021-12-04 NOTE — Progress Notes (Signed)
Subjective:   Patient ID: Kim Simpson, female   DOB: 36 y.o.   MRN: [id number]   HPI Patient presents with pain in the right ankle and moderate swelling occurring associated with the foot and ankle with negative Bevelyn Buckles' sign noted.  States its been present for a fairly long time and is quite inflamed when she tries to walk and patient does not smoke likes to be active if possible   Review of Systems  All other systems reviewed and are negative.      Objective:  Physical Exam Vitals and nursing note reviewed.  Constitutional:      Appearance: She is well-developed.  Pulmonary:     Effort: Pulmonary effort is normal.  Musculoskeletal:        General: Normal range of motion.  Skin:    General: Skin is warm.  Neurological:     Mental Status: She is alert.    Neurovascular status was found to be intact muscle strength is found to be adequate range of motion was found to be adequate subtalar midtarsal joint.  Patient's right ankle is inflamed in the sinus tarsi with fluid buildup and does have mild edema in the right ankle negative into the calf muscle or other pathology     Assessment:  Inflammatory capsulitis right with fluid buildup of the sinus tarsi with mild swelling     Plan:  Standard prep went ahead today injected the sinus tarsi right 3 mg Kenalog 5 mg Xylocaine into the deep ankle and applied ankle compression stocking along with fascial brace to hold the arch properly.  Reappoint for [place] to recheck  X-rays indicate that there is no indications of arthritis or fracture associated with the inflammation the patient experiences

## 2021-12-10 ENCOUNTER — Other Ambulatory Visit: Payer: Self-pay | Admitting: Podiatry

## 2021-12-10 DIAGNOSIS — M779 Enthesopathy, unspecified: Secondary | ICD-10-CM

## 2021-12-24 ENCOUNTER — Ambulatory Visit: Payer: 59 | Admitting: Podiatry

## 2022-01-28 ENCOUNTER — Encounter: Payer: 59 | Admitting: Sports Medicine

## 2022-02-04 ENCOUNTER — Ambulatory Visit: Payer: 59 | Admitting: Podiatry

## 2022-02-04 ENCOUNTER — Ambulatory Visit (INDEPENDENT_AMBULATORY_CARE_PROVIDER_SITE_OTHER): Payer: 59 | Admitting: Sports Medicine

## 2022-02-04 ENCOUNTER — Ambulatory Visit (INDEPENDENT_AMBULATORY_CARE_PROVIDER_SITE_OTHER): Payer: 59

## 2022-02-04 DIAGNOSIS — M79671 Pain in right foot: Secondary | ICD-10-CM

## 2022-02-04 DIAGNOSIS — Q8589 Other phakomatoses, not elsewhere classified: Secondary | ICD-10-CM | POA: Insufficient documentation

## 2022-02-04 DIAGNOSIS — E8940 Asymptomatic postprocedural ovarian failure: Secondary | ICD-10-CM

## 2022-02-04 DIAGNOSIS — M25571 Pain in right ankle and joints of right foot: Secondary | ICD-10-CM

## 2022-02-04 DIAGNOSIS — G8929 Other chronic pain: Secondary | ICD-10-CM | POA: Diagnosis not present

## 2022-02-04 DIAGNOSIS — M816 Localized osteoporosis [Lequesne]: Secondary | ICD-10-CM

## 2022-02-04 DIAGNOSIS — M810 Age-related osteoporosis without current pathological fracture: Secondary | ICD-10-CM | POA: Insufficient documentation

## 2022-02-04 MED ORDER — MELOXICAM 15 MG PO TABS: ORAL_TABLET | ORAL | 3 refills | Status: DC

## 2022-02-04 NOTE — Assessment & Plan Note (Signed)
Kim Simpson is post bilateral oophorectomy, she is in surgical menopause, her last bone density test was at an outside facility a year and a half ago with a lumbar spine T score of -2.5. ?She does have osteoporosis, I would certainly recommend Evenity followed by [facility]. ?She has not yet been treated with anything. ?

## 2022-02-04 NOTE — Assessment & Plan Note (Signed)
Kim Simpson has a very complex history with her right foot and ankle, she had a slip and a fall over a year ago, ultimately ended up with fractures through the neck of the fifth metacarpal, subacute mildly comminuted fractures of her medial malleolus as well as some tip fracture through the lateral malleolus, she also had a displaced avulsion fracture from the anterior process of the calcaneus as well as into placed avulsion from the dorsal cuboid. ?She has worked with podiatry for a long time, had some physical therapy, she was in a boot for a significant period of time. ?Unfortunately continues to have recurrent and persistent pain in her right foot and ankle. ?She has had multiple sessions of formal physical therapy. ?Ultimately she has had greater than 6 weeks of physician directed conservative treatment. ?A CT scan showed the fracture as noted above. ?Considering persistent pain particularly at the sinus tarsi, cuboid, and fifth metatarsal I think we need to proceed with updated x-rays and an MRI of her foot and ankle. ?We will add meloxicam, and she has a lace up ASO that she will start wearing. ?Based on the results of the MRI which could show ligamentous disruption or stress injuries I think we would probably need to place her in a fiberglass cast for 4 to 6 weeks followed by custom orthotics, physical therapy and prolonged wear of an ASO. ? ?

## 2022-02-04 NOTE — Progress Notes (Signed)
? ? ?  Procedures performed today:   ? ?None. ? ?Independent interpretation of notes and tests performed by another provider:  ? ?None. ? ?Brief History, Exam, Impression, and Recommendations:   ? ?Osteoporosis ?Kim Simpson is post bilateral oophorectomy, she is in surgical menopause, her last bone density test was at an outside facility a year and a half ago with a lumbar spine T score of -2.5. ?She does have osteoporosis, I would certainly recommend Evenity followed by [facility]. ?She has not yet been treated with anything. ? ?Chronic pain of right ankle ?Kim Simpson has a very complex history with her right foot and ankle, she had a slip and a fall over a year ago, ultimately ended up with fractures through the neck of the fifth metacarpal, subacute mildly comminuted fractures of her medial malleolus as well as some tip fracture through the lateral malleolus, she also had a displaced avulsion fracture from the anterior process of the calcaneus as well as into placed avulsion from the dorsal cuboid. ?She has worked with podiatry for a long time, had some physical therapy, she was in a boot for a significant period of time. ?Unfortunately continues to have recurrent and persistent pain in her right foot and ankle. ?She has had multiple sessions of formal physical therapy. ?Ultimately she has had greater than 6 weeks of physician directed conservative treatment. ?A CT scan showed the fracture as noted above. ?Considering persistent pain particularly at the sinus tarsi, cuboid, and fifth metatarsal I think we need to proceed with updated x-rays and an MRI of her foot and ankle. ?We will add meloxicam, and she has a lace up ASO that she will start wearing. ?Based on the results of the MRI which could show ligamentous disruption or stress injuries I think we would probably need to place her in a fiberglass cast for 4 to 6 weeks followed by custom orthotics, physical therapy and prolonged wear of an  ASO. ? ? ? ?___________________________________________ ?Gwen Her. Dianah Field, M.D., ABFM., CAQSM. ?Primary Care and Sports Medicine ?[place] ? ?Adjunct Instructor of Family Medicine  ?[employer] ?

## 2022-02-09 ENCOUNTER — Encounter: Payer: Self-pay | Admitting: Sports Medicine

## 2022-02-16 ENCOUNTER — Other Ambulatory Visit: Payer: 59

## 2022-02-20 ENCOUNTER — Ambulatory Visit (INDEPENDENT_AMBULATORY_CARE_PROVIDER_SITE_OTHER): Payer: 59

## 2022-02-20 DIAGNOSIS — G8929 Other chronic pain: Secondary | ICD-10-CM

## 2022-02-20 DIAGNOSIS — M25571 Pain in right ankle and joints of right foot: Secondary | ICD-10-CM

## 2022-02-23 ENCOUNTER — Encounter: Payer: Self-pay | Admitting: Sports Medicine

## 2022-02-23 DIAGNOSIS — G8929 Other chronic pain: Secondary | ICD-10-CM

## 2022-03-02 ENCOUNTER — Telehealth: Payer: Self-pay

## 2022-03-02 NOTE — Telephone Encounter (Signed)
Per [employer] - MRI denied. Provider will need to complete a peer to peer at [id number] for this case. The reason of denial is that this is a duplicate study of a previous denied request with the same CPT code within the last 21 days. Request cannot be process without a peer to peer by provider.  ? ?[employer] Rep was informed directly that patient continues to have worsening symptoms, wearing an air cast boot and taking medication for pain.  ? ?ID #[id number] ?CASE #[id number] ?CPT CODE: 45364 ?

## 2022-03-03 NOTE — Telephone Encounter (Signed)
Insurance still denying it.  She has a terrible Buyer, retail.  She is out of luck.  She can call them to get more information or pay out of pocket.  The other option would be to wait 45 days and then send a new order. ? ? ? ?

## 2022-03-03 NOTE — Telephone Encounter (Signed)
Noted. Patient has been updated via [employer].  ?

## 2022-04-08 ENCOUNTER — Encounter: Payer: Self-pay | Admitting: Sports Medicine

## 2022-04-08 DIAGNOSIS — G8929 Other chronic pain: Secondary | ICD-10-CM

## 2022-04-14 ENCOUNTER — Telehealth: Payer: Self-pay

## 2022-04-14 DIAGNOSIS — G8929 Other chronic pain: Secondary | ICD-10-CM

## 2022-04-14 NOTE — Telephone Encounter (Signed)
Patient advised. Order faxed.

## 2022-04-14 NOTE — Telephone Encounter (Signed)
MRI ordered

## 2022-04-14 NOTE — Telephone Encounter (Signed)
Kim Simpson wants a foot MRI to see if her foot is healing. She wants the order to go to [employer] and pay cash without insurance.

## 2022-04-19 ENCOUNTER — Emergency Department (INDEPENDENT_AMBULATORY_CARE_PROVIDER_SITE_OTHER)
Admission: EM | Admit: 2022-04-19 | Discharge: 2022-04-19 | Disposition: A | Discharge status: Home / Self Care | Payer: 59 | Source: Home / Self Care | Attending: Family Medicine | Admitting: Family Medicine

## 2022-04-19 ENCOUNTER — Encounter: Payer: Self-pay | Admitting: Emergency Medicine

## 2022-04-19 DIAGNOSIS — L309 Dermatitis, unspecified: Secondary | ICD-10-CM | POA: Diagnosis not present

## 2022-04-19 DIAGNOSIS — B349 Viral infection, unspecified: Secondary | ICD-10-CM | POA: Diagnosis not present

## 2022-04-19 DIAGNOSIS — J029 Acute pharyngitis, unspecified: Secondary | ICD-10-CM | POA: Diagnosis not present

## 2022-04-19 DIAGNOSIS — R5383 Other fatigue: Secondary | ICD-10-CM

## 2022-04-19 LAB — POCT MONO SCREEN (KUC): Mono, POC: NEGATIVE

## 2022-04-19 LAB — POCT RAPID STREP A (OFFICE): Rapid Strep A Screen: NEGATIVE

## 2022-04-19 LAB — POC SARS CORONAVIRUS 2 AG -  ED: SARS Coronavirus 2 Ag: NEGATIVE

## 2022-04-19 LAB — POCT INFLUENZA A/B
Influenza A, POC: NEGATIVE
Influenza B, POC: NEGATIVE

## 2022-04-19 MED ORDER — CLOBETASOL PROPIONATE 0.05 % EX LOTN: 1.0000 "application " | TOPICAL_LOTION | Freq: Two times a day (BID) | CUTANEOUS | 0 refills | Status: DC

## 2022-04-19 NOTE — ED Triage Notes (Signed)
Pt states her father has mono and over the last week she has had fatigue, headaches, sore throat and is concerned she may have mono as well.

## 2022-04-19 NOTE — ED Provider Notes (Signed)
[name]    CSN: [id number] Arrival date & time: 04/19/22  1429      History   Chief Complaint Chief Complaint  Patient presents with   Fatigue    HPI Kim Simpson is a 36 y.o. female.   HPI  Patient states that she has had fatigue headache sore throat for the last week and is afraid she may have mono.  She states that her father is currently being treated for mono.  He had blood tests "for EBV" to diagnose his illness.  He is only starting to improve.  This patient states that her sister had mono but she does not think that she had mono as a child.  I explained that mono at her age and her father's age is rare, but we can do a Monospot and some other testing today.  She may need additional blood work, as her father did, for further evaluation of her symptoms.  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      Home Medications    Prior to Admission medications   Medication Sig Start Date End Date Taking? Authorizing Provider  Clobetasol Propionate 0.05 % lotion Apply 1 application  topically 2 (two) times daily. 04/19/22  Yes Raylene Everts, MD  albuterol (PROVENTIL HFA;VENTOLIN HFA) 108 (90 BASE) MCG/ACT inhaler Inhale 2 puffs into the lungs every 4 (four) hours as needed for wheezing or shortness of breath. 10/01/14   Jacqulyn Cane, MD  cetirizine (ZYRTEC) 10 MG tablet Take 10 mg by mouth daily.  10/01/14  [provider]    Family History Family History  Problem Relation Age of Onset   Hyperlipidemia Father    Colon polyps Brother     Social History Social  History   Tobacco Use   Smoking status: Never  Vaping Use   Vaping Use: Never used  Substance Use Topics   Alcohol use: No   Drug use: No     Allergies   Other, Cephalexin, Chlorine, Sulfamethoxazole-trimethoprim, and Aspirin   Review of Systems Review of Systems See HPI  Physical Exam Triage Vital Signs ED Triage Vitals  Enc Vitals Group     BP 04/19/22 1435 126/86     Pulse Rate 04/19/22 1435 89     Resp 04/19/22 1435 18     Temp 04/19/22 1435 97.9 F (36.6 C)     Temp Source 04/19/22 1435 Oral     SpO2 04/19/22 1435 98 %     Weight --      Height --      Head Circumference --      Peak Flow --      Pain Score 04/19/22 1436 4     Pain Loc --      Pain Edu? --      Excl. in [place]? --    No data found.  Updated Vital Signs BP 126/86 (BP Location: Right Arm)   Pulse 89   Temp 97.9 F (36.6 C) (Oral)   Resp 18   LMP  09/17/2014   SpO2 98%      Physical Exam Constitutional:      General: She is not in acute distress.    Appearance: She is well-developed.     Comments: Overweight.  No acute distress  HENT:     Head: Normocephalic and atraumatic.     Right Ear: There is no impacted cerumen.     Left Ear: There is impacted cerumen.     Nose: Nose normal. No congestion.     Mouth/Throat:     Pharynx: Posterior oropharyngeal erythema present.  Eyes:     Conjunctiva/sclera: Conjunctivae normal.     Pupils: Pupils are equal, round, and reactive to light.  Cardiovascular:     Rate and Rhythm: Normal rate and regular rhythm.     Heart sounds: Normal heart sounds.  Pulmonary:     Effort: Pulmonary effort is normal. No respiratory distress.     Breath sounds: Normal breath sounds.  Abdominal:     General: There is no distension.     Palpations: Abdomen is soft.  Musculoskeletal:        General: Normal range of motion.     Cervical back: Normal range of motion.  Lymphadenopathy:     Cervical: No cervical adenopathy.  Skin:    General: Skin is warm and  dry.     Comments: Hyperpigmented spots around lips, freckled skin, consistent with Peutz-Jeghers syndrome  Neurological:     Mental Status: She is alert.  Psychiatric:        Mood and Affect: Mood normal.        Behavior: Behavior normal.      UC Treatments / Results  Labs (all labs ordered are listed, but only abnormal results are displayed) Labs Reviewed  CULTURE, GROUP A STREP  POC SARS CORONAVIRUS 2 AG -  ED  POCT INFLUENZA A/B  POCT MONO SCREEN ([facility])  POCT RAPID STREP A (OFFICE)    EKG   Radiology No results found.  Procedures Procedures (including critical care time)  Medications Ordered in UC Medications - No data to display  Initial Impression / Assessment and Plan / UC Course  I have reviewed the triage vital signs and the nursing notes.  Pertinent labs & imaging results that were available during my care of the patient were reviewed by me and considered in my medical decision making (see chart for details).     Discussed that the Monospot is not accurate in the first 2 weeks of illness and that of her symptoms continue she should come back for retesting.  Or see a PCP.  Referral to PCP as discussed Final Clinical Impressions(s) / UC Diagnoses   Final diagnoses:  Viral illness  Other fatigue  Acute pharyngitis, unspecified etiology  Eczema, unspecified type     Discharge Instructions      The flu, COVID, strep, and mono tests are all negative Sometimes the monotest is negative at first and can be positive on future testing.  If you continue to feel bad you will need blood EBV antibody tests      ED Prescriptions     Medication Sig Dispense Auth. Provider   Clobetasol Propionate 0.05 % lotion Apply 1 application  topically 2 (two) times daily. 59 mL Raylene Everts, MD      PDMP not reviewed this encounter.   Raylene Everts, MD 04/19/22 1537

## 2022-04-19 NOTE — Discharge Instructions (Signed)
The flu, COVID, strep, and mono tests are all negative Sometimes the monotest is negative at first and can be positive on future testing.  If you continue to feel bad you will need blood EBV antibody tests

## 2022-04-22 LAB — CULTURE, GROUP A STREP: Strep A Culture: NEGATIVE

## 2022-04-29 ENCOUNTER — Telehealth: Payer: Self-pay | Admitting: Neurology

## 2022-04-29 DIAGNOSIS — G8929 Other chronic pain: Secondary | ICD-10-CM

## 2022-04-29 NOTE — Telephone Encounter (Signed)
Patient left vm wanting Dr. Darene Lamer to review MR done out of system.   Performed 04/27/2022 in [place]. See below. Please advise.   MRI Ankle Right  WO IV Contrast  Anatomical Region Laterality Modality  Leg -- Magnetic Resonance  Ankle -- --  Foot -- --   Impression  IMPRESSION:  1.  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  2.  Osteochondral abnormality and cystic changes in the medial tibial plafond/medial malleolus  3.  Partially imaged edema in the fourth metatarsal may relate to stress reaction.  4.  Trace tibiotalar joint effusion with synovitis.  5.  Small bony avulsion along the medial malleolus.  6.  Chronic sprain of the deltoid ligament.   Electronically Signed by: Salvadore Oxford, MD on 04/28/2022 2:58 PM Narrative  MRI ANKLE RIGHT WO IV CONTRAST   TECHNIQUE: Multiplanar, multisequence MR imaging of the right ankle was performed without contrast   COMPARISON: None   INDICATION: Pain in right ankle and joints of right foot pain since fracture.   FINDINGS:   Bones/joints:  #  Patchy edema in the fourth metatarsal is partially imaged.    #  Osteochondral lesion in the medial talar dome spans 19 x 11 mm.  #  Osteochondral abnormality and cystic changes in the medial tibial plafond/medial malleolus measures 8 x 5 mm.  #  Trace tibiotalar joint effusion with synovitis.  #  Small bone avulsion along the medial malleolus is likely chronic.  #  Mild calcaneal enthesopathic changes.  #  Bone island in the navicular.   Soft tissues:  #  Nonspecific subcutaneous edema along the dorsal midfoot.  #  No drainable fluid collection.  #  No suspicious soft tissue mass.   Medial flexor tendons (including posterior tibialis, flexor hallucis longus, and flexor digitorum longus tendons): Intact.   Peroneus longus and brevis tendons: Intact and located within the retromalleolar groove.   Extensor tendons: Intact   Achilles tendon: Intact   Plantar fascia:  Intact   Lateral ligamentous complex (including anterior and posterior tibiofibular, talofibular, and calcaneofibular ligaments): Intact   Deltoid ligament (including superficial and anterior/posterior deep tibiotalar fibers): Loss of striation cement deltoid ligament   Tarsal tunnel and sinus tarsi: Unremarkable.   Additional comments: None. Procedure Note  Salvadore Oxford, MD - 04/28/2022  Formatting of this note might be different from the original.  MRI ANKLE RIGHT WO IV CONTRAST   TECHNIQUE: Multiplanar, multisequence MR imaging of the right ankle was performed without contrast   COMPARISON: None   INDICATION: Pain in right ankle and joints of right foot pain since fracture.   FINDINGS:   Bones/joints:  #  Patchy edema in the fourth metatarsal is partially imaged.    #  Osteochondral lesion in the medial talar dome spans 19 x 11 mm.  #  Osteochondral abnormality and cystic changes in the medial tibial plafond/medial malleolus measures 8 x 5 mm.  #  Trace tibiotalar joint effusion with synovitis.  #  Small bone avulsion along the medial malleolus is likely chronic.  #  Mild calcaneal enthesopathic changes.  #  Bone island in the navicular.   Soft tissues:  #  Nonspecific subcutaneous edema along the dorsal midfoot.  #  No drainable fluid collection.  #  No suspicious soft tissue mass.   Medial flexor tendons (including posterior tibialis, flexor hallucis longus, and flexor digitorum longus tendons): Intact.   Peroneus longus and brevis tendons: Intact and located within  the retromalleolar groove.   Extensor tendons: Intact   Achilles tendon: Intact   Plantar fascia: Intact   Lateral ligamentous complex (including anterior and posterior tibiofibular, talofibular, and calcaneofibular ligaments): Intact   Deltoid ligament (including superficial and anterior/posterior deep tibiotalar fibers): Loss of striation cement deltoid ligament   Tarsal tunnel and sinus tarsi:  Unremarkable.   Additional comments: None.    IMPRESSION:  1.  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  2.  Osteochondral abnormality and cystic changes in the medial tibial plafond/medial malleolus  3.  Partially imaged edema in the fourth metatarsal may relate to stress reaction.  4.  Trace tibiotalar joint effusion with synovitis.  5.  Small bony avulsion along the medial malleolus.  6.  Chronic sprain of the deltoid ligament.   Electronically Signed by: Salvadore Oxford, MD on 04/28/2022 2:58 PM

## 2022-04-29 NOTE — Telephone Encounter (Signed)
Ankle MRI does show a very large osteochondral lesion in the talus, and essentially degenerative changes throughout the ankle joint, it also showed the findings that we knew from the foot MRI.  Ligaments were intact, I think we should try an injection into the ankle joint and if this fails she will need an ankle arthroscopic surgery.

## 2022-04-30 ENCOUNTER — Encounter: Payer: Self-pay | Admitting: Sports Medicine

## 2022-04-30 NOTE — Telephone Encounter (Signed)
Attempted to contact patient. No answer. Left a detailed vm msg of provider's note and recommendation. Patient advised to return a call back to the clinic for scheduling with Dr. Darene Lamer. Direct call back info provided.

## 2022-04-30 NOTE — Telephone Encounter (Signed)
Task completed. Responded back to patient via a Mychart message.

## 2022-05-14 ENCOUNTER — Ambulatory Visit (INDEPENDENT_AMBULATORY_CARE_PROVIDER_SITE_OTHER): Payer: 59 | Admitting: Sports Medicine

## 2022-05-14 ENCOUNTER — Ambulatory Visit (INDEPENDENT_AMBULATORY_CARE_PROVIDER_SITE_OTHER): Payer: 59

## 2022-05-14 DIAGNOSIS — G8929 Other chronic pain: Secondary | ICD-10-CM

## 2022-05-14 DIAGNOSIS — M25571 Pain in right ankle and joints of right foot: Secondary | ICD-10-CM

## 2022-05-14 NOTE — Progress Notes (Signed)
    Procedures performed today:    Procedure: Real-time Ultrasound Guided injection of the right ankle joint Device: Samsung HS60  Verbal informed consent obtained.  Time-out conducted.  Noted no overlying erythema, induration, or other signs of local infection.  Skin prepped in a sterile fashion.  Local anesthesia: Topical Ethyl chloride.  With sterile technique and under real time ultrasound guidance: Mild degenerative changes noted, 1 cc Kenalog 40, 1 cc lidocaine, 1 cc bupivacaine injected easily Completed without difficulty  Advised to call if fevers/chills, erythema, induration, drainage, or persistent bleeding.  Images permanently stored and available for review in PACS.  Impression: Technically successful ultrasound guided injection.  Independent interpretation of notes and tests performed by another provider:   None.  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 Failed conservative treatment including therapy, we injected her ankle joint today, we will also get a second opinion from orthopedic foot and ankle surgery considering the size of the OCD. Adding handicap placard.    ____________________________________________ Gwen Her. Dianah Field, M.D., ABFM., CAQSM., AME. Primary Care and Sports Medicine [facility]  Adjunct Professor of [employer]  Risk manager

## 2022-05-14 NOTE — Assessment & Plan Note (Signed)
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 Failed conservative treatment including therapy, we injected her ankle joint today, we will also get a second opinion from orthopedic foot and ankle surgery considering the size of the OCD. Adding handicap placard.

## 2022-05-17 ENCOUNTER — Emergency Department
Admission: EM | Admit: 2022-05-17 | Discharge: 2022-05-17 | Disposition: A | Discharge status: Home / Self Care | Payer: 59 | Attending: Family Medicine | Admitting: Family Medicine

## 2022-05-17 ENCOUNTER — Encounter: Payer: Self-pay | Admitting: Emergency Medicine

## 2022-05-17 DIAGNOSIS — J3489 Other specified disorders of nose and nasal sinuses: Secondary | ICD-10-CM

## 2022-05-17 DIAGNOSIS — H6983 Other specified disorders of Eustachian tube, bilateral: Secondary | ICD-10-CM | POA: Diagnosis not present

## 2022-05-17 DIAGNOSIS — H9203 Otalgia, bilateral: Secondary | ICD-10-CM | POA: Diagnosis not present

## 2022-05-17 MED ORDER — TRIAMCINOLONE ACETONIDE 55 MCG/ACT NA AERO: 2.0000 | INHALATION_SPRAY | Freq: Every day | NASAL | 0 refills | Status: DC

## 2022-05-17 NOTE — Discharge Instructions (Signed)
Increase fluids Use the nasal spray 2 times a day for the first couple days then down to once a day until your symptoms are gone See your primary care doctor August 9

## 2022-05-17 NOTE — ED Provider Notes (Signed)
[facility]    CSN: [id number] Arrival date & time: 05/17/22  1839      History   Chief Complaint Chief Complaint  Patient presents with   Otalgia    HPI Kim Simpson is a 36 y.o. female.   HPI  Patient has bilateral ear pain.  She states that it comes and goes.  She also has some sinus pressure.  Does not feel like her typical allergies.  She has no fever chills or body aches.  No sore throat.  Hearing is normal.  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      Home Medications    Prior to Admission medications   Medication Sig Start Date End Date Taking? Authorizing Provider  triamcinolone (NASACORT) 55 MCG/ACT AERO nasal inhaler Place 2 sprays into the nose daily. 05/17/22  Yes Raylene Everts, MD  albuterol (PROVENTIL HFA;VENTOLIN HFA) 108 (90 BASE) MCG/ACT inhaler Inhale 2 puffs into the lungs every 4 (four) hours as needed for wheezing or shortness of breath. 10/01/14   Jacqulyn Cane, MD  Clobetasol Propionate 0.05 % lotion Apply 1 application  topically 2 (two) times daily. 04/19/22   Raylene Everts, MD  cetirizine (ZYRTEC) 10 MG tablet Take 10 mg by mouth daily.  10/01/14  [provider]    Family History Family History  Problem Relation Age of Onset   Hyperlipidemia Father    Colon polyps Brother     Social History Social History   Tobacco Use   Smoking status: Never  Vaping Use   Vaping Use: Never used  Substance Use Topics   Alcohol use: No   Drug use: No     Allergies   Other, Cephalexin, Chlorine, Sulfamethoxazole-trimethoprim,  and Aspirin   Review of Systems Review of Systems  See HPI Physical Exam Triage Vital Signs ED Triage Vitals  Enc Vitals Group     BP 05/17/22 1850 131/85     Pulse Rate 05/17/22 1850 (!) 110     Resp 05/17/22 1850 18     Temp 05/17/22 1850 98.8 F (37.1 C)     Temp Source 05/17/22 1850 Oral     SpO2 05/17/22 1850 96 %     Weight 05/17/22 1852 200 lb (90.7 kg)     Height 05/17/22 1852 '5\' 5"'$  (1.651 m)     Head Circumference --      Peak Flow --      Pain Score 05/17/22 1851 2     Pain Loc --      Pain Edu? --      Excl. in [place]? --    No data found.  Updated Vital Signs BP 131/85 (BP Location: Right Arm)   Pulse (!) 110   Temp 98.8 F (37.1 C) (Oral)   Resp 18   Ht '5\' 5"'$  (1.651 m)   Wt 90.7 kg   LMP 09/17/2014   SpO2 96%   BMI 33.28 kg/m       Physical Exam Constitutional:      General:  She is not in acute distress.    Appearance: She is well-developed.  HENT:     Head: Normocephalic and atraumatic.     Right Ear: Tympanic membrane, ear canal and external ear normal.     Left Ear: Tympanic membrane, ear canal and external ear normal.     Nose: No congestion.     Mouth/Throat:     Mouth: Mucous membranes are moist.     Pharynx: No posterior oropharyngeal erythema.  Eyes:     Conjunctiva/sclera: Conjunctivae normal.     Pupils: Pupils are equal, round, and reactive to light.  Cardiovascular:     Rate and Rhythm: Normal rate.  Pulmonary:     Effort: Pulmonary effort is normal. No respiratory distress.  Abdominal:     General: There is no distension.     Palpations: Abdomen is soft.  Musculoskeletal:        General: Normal range of motion.     Cervical back: Normal range of motion.  Skin:    General: Skin is warm and dry.  Neurological:     Mental Status: She is alert.  Psychiatric:        Mood and Affect: Mood normal.        Behavior: Behavior normal.      UC Treatments / Results  Labs (all labs ordered are listed, but only abnormal results  are displayed) Labs Reviewed - No data to display  EKG   Radiology No results found.  Procedures Procedures (including critical care time)  Medications Ordered in UC Medications - No data to display  Initial Impression / Assessment and Plan / UC Course  I have reviewed the triage vital signs and the nursing notes.  Pertinent labs & imaging results that were available during my care of the patient were reviewed by me and considered in my medical decision making (see chart for details).     Final Clinical Impressions(s) / UC Diagnoses   Final diagnoses:  Eustachian tube dysfunction, bilateral  Sinus pressure  Otalgia of both ears     Discharge Instructions      Increase fluids Use the nasal spray 2 times a day for the first couple days then down to once a day until your symptoms are gone See your primary care doctor August 9   ED Prescriptions     Medication Sig Dispense Auth. Provider   triamcinolone (NASACORT) 55 MCG/ACT AERO nasal inhaler Place 2 sprays into the nose daily. 1 each Raylene Everts, MD      PDMP not reviewed this encounter.   Raylene Everts, MD 05/17/22 [phone]

## 2022-05-17 NOTE — ED Triage Notes (Signed)
Patient c/o bilateral ear pain for a couple of days, no fever, no cough or congestion.  Possible swimmers ear.  Patient did go swimming a couple of weeks ago.  Patient has taken Tylenol for the pain.

## 2022-05-25 NOTE — Progress Notes (Unsigned)
   New Patient Office Visit  Subjective    Patient ID: Kim Simpson, female    DOB: Apr 19, 1986  Age: 35 y.o. MRN: [id number]  CC: No chief complaint on file.   HPI Kim Simpson presents to establish care ***  Outpatient Encounter Medications as of 05/26/2022  Medication Sig   albuterol (PROVENTIL HFA;VENTOLIN HFA) 108 (90 BASE) MCG/ACT inhaler Inhale 2 puffs into the lungs every 4 (four) hours as needed for wheezing or shortness of breath.   Clobetasol Propionate 0.05 % lotion Apply 1 application  topically 2 (two) times daily.   triamcinolone (NASACORT) 55 MCG/ACT AERO nasal inhaler Place 2 sprays into the nose daily.   [DISCONTINUED] cetirizine (ZYRTEC) 10 MG tablet Take 10 mg by mouth daily.   No facility-administered encounter medications on file as of 05/26/2022.    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[place])     Past Surgical History:  Procedure Laterality Date   ABDOMINAL SURGERY     MASTECTOMY      Family History  Problem Relation Age of Onset   Hyperlipidemia Father    Colon polyps Brother     Social History   Socioeconomic History   Marital status: Single    Spouse name: Not on file   Number of children: Not on file   Years of education: Not on file   Highest education level: Not on file  Occupational History   Not on file  Tobacco Use   Smoking status: Never   Smokeless tobacco: Not on file  Vaping Use   Vaping Use: Never used  Substance and Sexual Activity   Alcohol use: No   Drug use: No   Sexual activity: Not on file  Other Topics Concern   Not on file  Social History Narrative   Not on file   Social Determinants of Health   Financial Resource Strain: Not on file  Food Insecurity: Not on file  Transportation Needs: Not on file  Physical Activity: Not on file  Stress: Not on file  Social Connections: Not on file  Intimate Partner Violence: Not on file    ROS      Objective    LMP  09/17/2014   Physical Exam  {Labs (Optional):23779}    Assessment & Plan:   Problem List Items Addressed This Visit   None   No follow-ups on file.   Owens Loffler, DO

## 2022-05-26 ENCOUNTER — Ambulatory Visit (INDEPENDENT_AMBULATORY_CARE_PROVIDER_SITE_OTHER): Payer: Self-pay | Admitting: Family Medicine

## 2022-05-26 ENCOUNTER — Ambulatory Visit (INDEPENDENT_AMBULATORY_CARE_PROVIDER_SITE_OTHER): Payer: Self-pay

## 2022-05-26 ENCOUNTER — Ambulatory Visit: Payer: Self-pay

## 2022-05-26 ENCOUNTER — Encounter: Payer: Self-pay | Admitting: Family Medicine

## 2022-05-26 VITALS — BP 123/86 | HR 99 | Ht 65.0 in | Wt 203.0 lb

## 2022-05-26 DIAGNOSIS — B49 Unspecified mycosis: Secondary | ICD-10-CM | POA: Insufficient documentation

## 2022-05-26 DIAGNOSIS — Q8589 Other phakomatoses, not elsewhere classified: Secondary | ICD-10-CM

## 2022-05-26 DIAGNOSIS — F4321 Adjustment disorder with depressed mood: Secondary | ICD-10-CM | POA: Insufficient documentation

## 2022-05-26 DIAGNOSIS — E8940 Asymptomatic postprocedural ovarian failure: Secondary | ICD-10-CM

## 2022-05-26 DIAGNOSIS — R222 Localized swelling, mass and lump, trunk: Secondary | ICD-10-CM

## 2022-05-26 MED ORDER — CLOTRIMAZOLE-BETAMETHASONE 1-0.05 % EX CREA: 1.0000 | TOPICAL_CREAM | Freq: Every day | CUTANEOUS | 0 refills | Status: DC

## 2022-05-26 MED ORDER — CLOBETASOL PROPIONATE 0.05 % EX OINT: 1.0000 | TOPICAL_OINTMENT | Freq: Two times a day (BID) | CUTANEOUS | 0 refills | Status: DC

## 2022-05-26 NOTE — Assessment & Plan Note (Signed)
-   have sent referrals for appropriate specialists since she has been lost to follow up with GI, Plastics, Heme/onc

## 2022-05-26 NOTE — Assessment & Plan Note (Signed)
-   pt is still experiencing grief from her mother's passing - does have good social support between her father and siblings  - will re-eval her mood in 3 months and see if she would benefit from SSRI. Did heavily discuss SSRI and side effects and provided pt with two options of medications she can look up - denies homicidal and suicidal ideation

## 2022-05-26 NOTE — Assessment & Plan Note (Signed)
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 Have ordered referral for gynecology to discuss and if so what treatment since she can't have hormone therapy

## 2022-05-26 NOTE — Assessment & Plan Note (Signed)
-   pt noted mass in her breast however mass is more located under breast in diaphragm/ chest wall area. Does not look to involve breast tissue. Have ordered soft tissue US to further investigate

## 2022-05-26 NOTE — Assessment & Plan Note (Signed)
-   sent in antifungal for tinea pedis

## 2022-05-26 NOTE — Patient Instructions (Signed)
Sertraline (zoloft) Escitalopam (lexapro)

## 2022-05-27 ENCOUNTER — Encounter: Payer: Self-pay | Admitting: *Deleted

## 2022-05-27 NOTE — Progress Notes (Signed)
Reached out to Kela Millin to introduce myself as the office RN Navigator and explain our new patient process. Reviewed the reason for their referral and scheduled their new patient appointment along with labs. Provided address and directions to the office including call back phone number. Reviewed with patient any concerns they may have or any possible barriers to attending their appointment.   Informed patient about my role as a navigator and that I will meet with them prior to their New Patient appointment and more fully discuss what services I can provide. At this time patient has no further questions or needs.    Sent patient all new patient information via MyChart with her approval.   Oncology Nurse Navigator Documentation     05/27/2022    8:15 AM  Oncology Nurse Navigator Flowsheets  Navigator Follow Up Date: 06/09/2022  Navigator Follow Up Reason: New Patient Appointment  Navigator Location [facility]  Referral Date to RadOnc/MedOnc 05/26/2022  Navigator Encounter Type Introductory Phone Call  Patient Visit Type MedOnc  Treatment Phase Post-Tx Follow-up  Barriers/Navigation Needs Coordination of Care;Education  Education Other  Interventions Coordination of Care;Education  Acuity Level 2-Minimal Needs (1-2 Barriers Identified)  Coordination of Care Appts  Education Method Verbal;Written  Time Spent with Patient 45

## 2022-06-02 DIAGNOSIS — G4700 Insomnia, unspecified: Secondary | ICD-10-CM | POA: Diagnosis not present

## 2022-06-02 DIAGNOSIS — M810 Age-related osteoporosis without current pathological fracture: Secondary | ICD-10-CM | POA: Diagnosis not present

## 2022-06-02 DIAGNOSIS — E669 Obesity, unspecified: Secondary | ICD-10-CM | POA: Diagnosis not present

## 2022-06-02 DIAGNOSIS — E559 Vitamin D deficiency, unspecified: Secondary | ICD-10-CM | POA: Diagnosis not present

## 2022-06-02 DIAGNOSIS — R5383 Other fatigue: Secondary | ICD-10-CM | POA: Diagnosis not present

## 2022-06-02 DIAGNOSIS — Z853 Personal history of malignant neoplasm of breast: Secondary | ICD-10-CM | POA: Diagnosis not present

## 2022-06-09 ENCOUNTER — Inpatient Hospital Stay: Payer: Self-pay | Admitting: Hematology & Oncology

## 2022-06-09 ENCOUNTER — Inpatient Hospital Stay: Payer: Self-pay

## 2022-06-10 ENCOUNTER — Inpatient Hospital Stay: Payer: BC Managed Care – PPO

## 2022-06-10 ENCOUNTER — Encounter: Payer: Self-pay | Admitting: *Deleted

## 2022-06-10 ENCOUNTER — Encounter: Payer: Self-pay | Admitting: Hematology & Oncology

## 2022-06-10 ENCOUNTER — Other Ambulatory Visit: Payer: Self-pay

## 2022-06-10 ENCOUNTER — Inpatient Hospital Stay: Payer: BC Managed Care – PPO | Attending: Hematology & Oncology | Admitting: Hematology & Oncology

## 2022-06-10 VITALS — BP 120/70 | HR 109 | Temp 98.3°F | Resp 18 | Ht 65.0 in | Wt 208.0 lb

## 2022-06-10 DIAGNOSIS — Z9013 Acquired absence of bilateral breasts and nipples: Secondary | ICD-10-CM | POA: Diagnosis not present

## 2022-06-10 DIAGNOSIS — Z853 Personal history of malignant neoplasm of breast: Secondary | ICD-10-CM | POA: Insufficient documentation

## 2022-06-10 DIAGNOSIS — Q8589 Other phakomatoses, not elsewhere classified: Secondary | ICD-10-CM | POA: Diagnosis not present

## 2022-06-10 DIAGNOSIS — Z923 Personal history of irradiation: Secondary | ICD-10-CM | POA: Insufficient documentation

## 2022-06-10 LAB — CBC WITH DIFFERENTIAL (CANCER CENTER ONLY)
Abs Immature Granulocytes: 0.03 10*3/uL (ref 0.00–0.07)
Basophils Absolute: 0 10*3/uL (ref 0.0–0.1)
Basophils Relative: 1 %
Eosinophils Absolute: 0.1 10*3/uL (ref 0.0–0.5)
Eosinophils Relative: 1 %
HCT: 41.9 % (ref 36.0–46.0)
Hemoglobin: 13.9 g/dL (ref 12.0–15.0)
Immature Granulocytes: 0 %
Lymphocytes Relative: 24 %
Lymphs Abs: 1.8 10*3/uL (ref 0.7–4.0)
MCH: 30.3 pg (ref 26.0–34.0)
MCHC: 33.2 g/dL (ref 30.0–36.0)
MCV: 91.3 fL (ref 80.0–100.0)
Monocytes Absolute: 0.6 10*3/uL (ref 0.1–1.0)
Monocytes Relative: 8 %
Neutro Abs: 4.9 10*3/uL (ref 1.7–7.7)
Neutrophils Relative %: 66 %
Platelet Count: 184 10*3/uL (ref 150–400)
RBC: 4.59 MIL/uL (ref 3.87–5.11)
RDW: 12.7 % (ref 11.5–15.5)
WBC Count: 7.5 10*3/uL (ref 4.0–10.5)
nRBC: 0 % (ref 0.0–0.2)

## 2022-06-10 LAB — RETICULOCYTES
Immature Retic Fract: 7.6 % (ref 2.3–15.9)
RBC.: 4.5 MIL/uL (ref 3.87–5.11)
Retic Count, Absolute: 97.2 10*3/uL (ref 19.0–186.0)
Retic Ct Pct: 2.2 % (ref 0.4–3.1)

## 2022-06-10 LAB — CMP (CANCER CENTER ONLY)
ALT: 28 U/L (ref 0–44)
AST: 20 U/L (ref 15–41)
Albumin: 4.6 g/dL (ref 3.5–5.0)
Alkaline Phosphatase: 98 U/L (ref 38–126)
Anion gap: 9 (ref 5–15)
BUN: 14 mg/dL (ref 6–20)
CO2: 28 mmol/L (ref 22–32)
Calcium: 10.3 mg/dL (ref 8.9–10.3)
Chloride: 101 mmol/L (ref 98–111)
Creatinine: 0.75 mg/dL (ref 0.44–1.00)
GFR, Estimated: >60 mL/min (ref >60)
Glucose, Bld: 110 mg/dL — ABNORMAL HIGH (ref 70–99)
Potassium: 3.9 mmol/L (ref 3.5–5.1)
Sodium: 138 mmol/L (ref 135–145)
Total Bilirubin: 0.4 mg/dL (ref 0.3–1.2)
Total Protein: 7.7 g/dL (ref 6.5–8.1)

## 2022-06-10 LAB — CEA (IN HOUSE-CHCC): CEA (CHCC-In House): 1.85 ng/mL (ref 0.00–5.00)

## 2022-06-10 LAB — LACTATE DEHYDROGENASE: LDH: 207 U/L — ABNORMAL HIGH (ref 98–192)

## 2022-06-10 LAB — FERRITIN: Ferritin: 44 ng/mL (ref 11–307)

## 2022-06-10 NOTE — Progress Notes (Signed)
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  She comes in with her dad.  She is incredibly interesting.  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  She was in [place].  She has a very interesting history.  Her family has a Peutz-Jeghers syndrome.  Her mother has it.  Her brother has it.  She actually had a large polyp that had to be removed surgically.  This was probably back in 2014.  After that, she was found to have a locally advanced breast cancer of the left breast.  This was stage III.  She underwent neoadjuvant chemotherapy with AC x4 cycles followed by Taxol.  She then underwent bilateral mastectomies on 02/19/2016.  She was found to have 3 foci of malignancy.  She had lymphovascular space invasion.  She had 1-3 positive lymph nodes.  She Kallam underwent adjuvant radiation therapy.  She received 5000 rad.  This was I think completed in October 2017.  She had been on goserelin/letrozole.  She then was switched over to Aromasin.  Patient did undergo a oophorectomy.  She is not on any adjuvant therapy at this time.  She has not had any follow-up for the post she agrees with respect to her colon.  She really needs to have a colonoscopy.  She also needs to have some MRIs of the abdomen to assess for any other gastrointestinal issues.  She is certain is at high risk for pancreatic cancer and biliary tract cancer.  She has not lost weight.  She does have a brace on her right ankle.  She suffered a fracture.  She does see plastic surgery for the implants.  Apparently, the left breast implant has been causing some issues for her.  I think she sees plastic surgery in mid September.  She also needs to see a dermatologist.  She does have  numerous freckles.  I think she already has a dermatologist that she has seen in the past.  She does not smoke.  She really does not have any alcoholic beverages.  She has had no headache.  Her mother died of metastatic breast cancer.  Her mother had metastatic breast cancer that went to her brain.  As such, she is worried about metastasis to her brain.  Currently, I would have said that her performance status is probably ECOG 0.    Past Medical History:  Diagnosis Date   Cancer ([place])    Peutz-Jeghers syndrome ([place])   :   Past Surgical History:  Procedure Laterality Date   ABDOMINAL SURGERY     MASTECTOMY    :   Current Outpatient Medications:    clobetasol ointment (TEMOVATE) 9.44 %, Apply 1 Application topically 2 (two) times daily., Disp: 60 g, Rfl: 0   clotrimazole-betamethasone (LOTRISONE) cream, Apply 1 Application topically daily., Disp: 30 g, Rfl: 0   meloxicam (MOBIC) 15 MG tablet, Take 1 tablet by mouth daily., Disp: , Rfl: :  :   Allergies  Allergen Reactions   Aspirin Other (See Comments)    Encephalitis as a toddler   Cephalexin Hives and Itching   Chlorine Itching, Dermatitis and Rash   Sulfamethoxazole-Trimethoprim Itching and Rash    Reports rash on tops of feet; itching/burning    :  Family History  Problem Relation Age of Onset   Hyperlipidemia Father    Colon polyps Brother   :   Social History   Socioeconomic History   Marital status: Single    Spouse name: Not on file   Number of children: Not on file   Years of education: Not on file   Highest education level: Not on file  Occupational History   Not on file  Tobacco Use   Smoking status: Never   Smokeless tobacco: Not on file  Vaping Use   Vaping Use: Never used  Substance and Sexual Activity   Alcohol use: No   Drug use: No   Sexual activity: Not on file  Other Topics Concern   Not on file  Social History Narrative   Not on file   Social Determinants of Health    Financial Resource Strain: Not on file  Food Insecurity: Not on file  Transportation Needs: Not on file  Physical Activity: Not on file  Stress: Not on file  Social Connections: Not on file  Intimate Partner Violence: Not on file  :  Review of Systems  Constitutional: Negative.   HENT: Negative.    Eyes: Negative.   Respiratory: Negative.    Cardiovascular: Negative.   Gastrointestinal: Negative.   Genitourinary: Negative.   Musculoskeletal: Negative.   Skin: Negative.   Neurological: Negative.   Endo/Heme/Allergies: Negative.   Psychiatric/Behavioral: Negative.       Exam: Vital signs show temperature of 98.3.  Pulse 109.  Blood pressure 120/70.  Weight is 208 pounds.  $Remove'@IPVITALS'EiuHpPL$ @ Physical Exam Vitals reviewed.  Constitutional:      Comments: Chest wall exam shows bilateral implants.  The left implant is certainly a little more firm and little bit contracted.  There is no obvious mass.  There is no erythema or warmth.  There is no left axillary adenopathy.  There is no right axillary adenopathy.  HENT:     Head: Normocephalic and atraumatic.  Eyes:     Pupils: Pupils are equal, round, and reactive to light.  Cardiovascular:     Rate and Rhythm: Normal rate and regular rhythm.     Heart sounds: Normal heart sounds.  Pulmonary:     Effort: Pulmonary effort is normal.     Breath sounds: Normal breath sounds.  Abdominal:     General: Bowel sounds are normal.     Palpations: Abdomen is soft.     Comments: Abdominal exam shows a soft abdomen.  She has well-healed laparotomy scar.  There is no guarding or rebound tenderness.  There is no abdominal mass.  There is no palpable liver or spleen tip.  Musculoskeletal:        General: No tenderness or deformity. Normal range of motion.     Cervical back: Normal range of motion.     Comments: She has a brace on the right ankle.  Lymphadenopathy:     Cervical: No cervical adenopathy.  Skin:    General: Skin is warm and dry.      Findings: No erythema or rash.     Comments: Skin exam shows numerous freckles.  She has no suspicious looking nevi.  Neurological:     Mental Status: She is alert and oriented to person, place, and time.  Psychiatric:        Behavior: Behavior normal.        Thought Content: Thought content normal.        Judgment: Judgment normal.  Recent Labs    06/10/22 1346  WBC 7.5  HGB 13.9  HCT 41.9  PLT 184    Recent Labs    06/10/22 1346  NA 138  K 3.9  CL 101  CO2 28  GLUCOSE 110*  BUN 14  CREATININE 0.75  CALCIUM 10.3    Blood smear review: None  Pathology: None    Assessment and Plan: Kim Simpson is a very charming 36 year old white female.  She has a Peutz Jeghers syndrome.  She has had breast cancer from this.  She clearly needs to be on an aggressive surveillance program.  She deftly needs to have a colonoscopy.  Typically, I think it is recommended that colonoscopies be done every year or so.  She needs to have MRI of the abdomen.  She does not wish to have any radiation therapy.  As such, an MRI certainly would be a reasonable way to assess her.  She has had bilateral mastectomies so I do not believe that she needs any type of mammograms or MRI of the breast.  She is going to make an appointment to see dermatology.  We will have to get a hold of her gastroenterologist at [facility] who is done her endoscopic procedures before.  I am not sure when her last upper endoscopy was.  She typically has been seen in the past every 6 months.  We can certainly continue her on an every 106-month follow-up.  This is an incredibly interesting situation.  She is very very nice.  I did feel bad that her mom passed away.  Again our goal is to make sure that we aggressively undertake a surveillance protocol for her.  I will plan to see her back in 6 months.

## 2022-06-11 LAB — IRON AND IRON BINDING CAPACITY (CC-WL,HP ONLY)
Iron: 82 ug/dL (ref 28–170)
Saturation Ratios: 24 % (ref 10.4–31.8)
TIBC: 349 ug/dL (ref 250–450)
UIBC: 267 ug/dL (ref 148–442)

## 2022-06-11 NOTE — Progress Notes (Signed)
Initial RN Navigator Patient Visit  Name: Kim Simpson Date of Referral : 05/26/2022 Diagnosis: Peutz-Jeghers Syndrome  Met with patient prior to their visit with MD. Hanley Seamen patient "Your Patient Navigator" handout which explains my role, areas in which I am able to help, and all the contact information for myself and the office. Also gave patient MD and Navigator business card. Reviewed with patient the general overview of expected course after initial diagnosis and time frame for all steps to be completed.  Patient comes in with her father. She lives with him. Works full time but has some flexibility to schedule scans/appointments. She states that she is behind on several surveillance needs and will ne requesting those from Dr Marin Olp.   Will follow up tomorrow once MD places orders and referrals to get patient scheduled.  Patient understands all follow up procedures and expectations. They have my number to reach out for any further clarification or additional needs.    Oncology Nurse Navigator Documentation     06/10/2022    2:15 PM  Oncology Nurse Navigator Flowsheets  Navigator Follow Up Date: 06/11/2022  Navigator Follow Up Reason: Appointment Review  Navigator Location [facility]  Navigator Encounter Type Initial MedOnc  Patient Visit Type MedOnc  Treatment Phase Post-Tx Follow-up  Barriers/Navigation Needs Coordination of Care;Education  Education Other  Interventions Education;Psycho-Social Support  Acuity Level 2-Minimal Needs (1-2 Barriers Identified)  Education Method Verbal;Written  Support Groups/Services Friends and Family  Time Spent with Patient 30

## 2022-06-14 ENCOUNTER — Other Ambulatory Visit: Payer: BC Managed Care – PPO

## 2022-06-14 ENCOUNTER — Inpatient Hospital Stay: Payer: BC Managed Care – PPO

## 2022-06-14 NOTE — Progress Notes (Signed)
Gem Work  Clinical Social Work was referred by Art therapist for assessment of psychosocial needs.  Clinical Social Worker contacted patient by phone  to offer support and assess for needs.    Patient stated she was waiting for her MRI to be scheduled.  She is having to make appointments in a new health care system and said she understands it will take some time.  She also stated she has developed a cough.  Unfortunately, the call was disconnected.  CSW phoned her back and left a message on her vm.   Margaree Mackintosh, LCSW  Clinical Social Worker [facility]

## 2022-06-16 ENCOUNTER — Encounter: Payer: Self-pay | Admitting: *Deleted

## 2022-06-16 NOTE — Progress Notes (Signed)
Scans are still pending with patient's insurance. Spoke to our insurance specialist and she stated that this insurance provider is known for a slow response time and can take up to 15 days to respond.  Called and got patient's voicemail. Left message for patient explaining the delay in scheduling the scans and that as soon as we got authorization we would get them scheduled. Call back number left.   Oncology Nurse Navigator Documentation     06/16/2022    2:00 PM  Oncology Nurse Navigator Flowsheets  Navigator Follow Up Date: 06/18/2022  Navigator Follow Up Reason: Radiology  Navigator Location [facility]  Navigator Encounter Type Telephone  Telephone Patient Update;Outgoing Call  Patient Visit Type MedOnc  Treatment Phase Post-Tx Follow-up  Barriers/Navigation Needs Coordination of Care;Education  Education Other  Interventions Education  Acuity Level 2-Minimal Needs (1-2 Barriers Identified)  Education Method Verbal  Support Groups/Services Friends and Family  Time Spent with Patient 30

## 2022-06-17 DIAGNOSIS — E669 Obesity, unspecified: Secondary | ICD-10-CM | POA: Diagnosis not present

## 2022-06-17 DIAGNOSIS — R5383 Other fatigue: Secondary | ICD-10-CM | POA: Diagnosis not present

## 2022-06-17 DIAGNOSIS — M810 Age-related osteoporosis without current pathological fracture: Secondary | ICD-10-CM | POA: Diagnosis not present

## 2022-06-17 DIAGNOSIS — E8940 Asymptomatic postprocedural ovarian failure: Secondary | ICD-10-CM | POA: Diagnosis not present

## 2022-06-22 ENCOUNTER — Encounter: Payer: Self-pay | Admitting: *Deleted

## 2022-06-22 NOTE — Progress Notes (Signed)
Patient called stating that she received letters in the mail from her insurance stating that the MRI's were approved starting 06/24/2022. She wants to know if she can schedule the MRIs. Secondly, she wants to see a new GI physician. She doesn't want to see her previous office, but rather an office with [place]. She also asks to see a GI physician who is familiar with Peutz-Jeghers Syndrome.   Asked our insurance specialist to follow up on scan authorizations.   Called [facility] and left a message requesting that the referral coordinator call me back with any physician who may have familiarity with the above syndrome and would see patient.   Called [facility] and spoke to their referral coordinator. She will check with the clinic supervisor and call back.   The insurance specialist after a long hold was disconnected. She requests copies of the letters that patient received. Called patient and she will attempt to take photos and send to me via MyChart this afternoon when she's home from work.   1530: Received confirmation that scans have been authorized. Called patient and left message stating that scans have been approved. Provided her with the number to schedule the scans at [facility].   Oncology Nurse Navigator Documentation     06/22/2022    2:45 PM  Oncology Nurse Navigator Flowsheets  Navigator Follow Up Date: 06/23/2022  Navigator Follow Up Reason: Appointment Review  Navigator Location [facility]  Navigator Encounter Type Telephone;Appt/Treatment Plan Review  Telephone Patient Update;Incoming Call;Outgoing Call  Patient Visit Type MedOnc  Treatment Phase Post-Tx Follow-up  Barriers/Navigation Needs Coordination of Care;Education  Education Other  Interventions Coordination of Care;Education;Psycho-Social Support  Acuity Level 2-Minimal Needs (1-2 Barriers Identified)  Coordination of Care Other  Education Method Verbal  Support Groups/Services Friends and Family   Time Spent with Patient 38

## 2022-06-23 ENCOUNTER — Encounter: Payer: Self-pay | Admitting: *Deleted

## 2022-06-23 ENCOUNTER — Telehealth: Payer: Self-pay | Admitting: Gastroenterology

## 2022-06-23 NOTE — Progress Notes (Signed)
Received a call from [place] at [facility]. She states her office confirms the ability to care for a patient with Peutz-Jeghers Syndrome. Patient information given. Since patient is established with [facility], patient will be considered a transfer of care. They do not need a referral order, however they do need medical records, specifically patient's operative and pathology reports.   Patient will need to contact her prior GI office and ask for her records to be sent to [facility] at [phone].  Message sent explaining all the above to the patient using MyChart.  Oncology Nurse Navigator Documentation     06/23/2022    2:00 PM  Oncology Nurse Navigator Flowsheets  Navigator Location [facility]  Navigator Encounter Type Appt/Treatment Plan Review;MyChart  Patient Visit Type MedOnc  Treatment Phase Post-Tx Follow-up  Barriers/Navigation Needs Coordination of Care;Education  Interventions Coordination of Care;Education  Acuity Level 2-Minimal Needs (1-2 Barriers Identified)  Coordination of Care Other  Education Method Written  Support Groups/Services Friends and Family  Time Spent with Patient 62

## 2022-06-23 NOTE — Telephone Encounter (Signed)
York Cerise from the [facility] called to get patient into [facility] to continue her care. Patient has GI history with [facility] requested records for review.

## 2022-06-23 NOTE — Progress Notes (Signed)
Spoke to patient this morning. She would like me to schedule the scans. She would like the scans scheduled either later in the day or on the weekend.   Spoke to scheduling at [facility] and they prefer to schedule directly with the patient. Shared her preference with them. They can scheduled on 9/11 or 9/12. They will attempt to contact patient.   Received call back from Geronimo at [facility]. She states she spoke to the providers and due to the complexity of Peutz-Jeghers Syndrome they feel like she would be better served at a tertiary center such as [facility] or [facility].   Called [facility] again and left another message asking the same of their providers.   Oncology Nurse Navigator Documentation     06/23/2022    9:45 AM  Oncology Nurse Navigator Flowsheets  Navigator Location [facility]  Navigator Encounter Type Telephone;Appt/Treatment Plan Review  Telephone Outgoing Call  Patient Visit Type MedOnc  Treatment Phase Post-Tx Follow-up  Barriers/Navigation Needs Coordination of Care;Education  Education Other  Interventions Coordination of Care;Education  Acuity Level 2-Minimal Needs (1-2 Barriers Identified)  Coordination of Care Radiology  Education Method Verbal  Support Groups/Services Friends and Family  Time Spent with Patient 51

## 2022-06-25 ENCOUNTER — Ambulatory Visit (INDEPENDENT_AMBULATORY_CARE_PROVIDER_SITE_OTHER): Payer: BC Managed Care – PPO

## 2022-06-25 ENCOUNTER — Ambulatory Visit (INDEPENDENT_AMBULATORY_CARE_PROVIDER_SITE_OTHER): Payer: BC Managed Care – PPO | Admitting: Sports Medicine

## 2022-06-25 DIAGNOSIS — G8929 Other chronic pain: Secondary | ICD-10-CM

## 2022-06-25 DIAGNOSIS — M940 Chondrocostal junction syndrome [Tietze]: Secondary | ICD-10-CM

## 2022-06-25 DIAGNOSIS — M25571 Pain in right ankle and joints of right foot: Secondary | ICD-10-CM

## 2022-06-25 DIAGNOSIS — M816 Localized osteoporosis [Lequesne]: Secondary | ICD-10-CM

## 2022-06-25 DIAGNOSIS — R0789 Other chest pain: Secondary | ICD-10-CM | POA: Diagnosis not present

## 2022-06-25 NOTE — Assessment & Plan Note (Signed)
Kim Simpson has also been having some left-sided chest wall pain, she localizes this over the left costal margin. On exam she has tenderness at the left costal margin. We will get a chest x-ray considering her history, she has meloxicam if she desires, and I would like to bring her some information out on it. Return to see me as needed for this.

## 2022-06-25 NOTE — Assessment & Plan Note (Signed)
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 She had a bone density test about 2 years ago that showed osteoporosis, I will go ahead and order a new bone density test, if it does show osteoporosis I have recommended Evenity for a year followed by [facility].

## 2022-06-25 NOTE — Progress Notes (Signed)
    Procedures performed today:    None.  Independent interpretation of notes and tests performed by another provider:   None.  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 We did an injection at the last visit she returns today doing really well, her pain is gone if she wears her brace and takes an occasional Tylenol. She has not yet started meloxicam, she understands this can continue to be an option if she has persistent or worsening discomfort. Return to see me as needed for this.  Osteoporosis Kim Simpson is in surgical menopause, she is post bilateral oophorectomy. She had a bone density test about 2 years ago that showed osteoporosis, I will go ahead and order a new bone density test, if it does show osteoporosis I have recommended Evenity for a year followed by Prolia.   Costochondritis Kim Simpson has also been having some left-sided chest wall pain, she localizes this over the left costal margin. On exam she has tenderness at the left costal margin. We will get a chest x-ray considering her history, she has meloxicam if she desires, and I would like to bring her some information out on it. Return to see me as needed for this.    ____________________________________________ Gwen Her. Dianah Field, M.D., ABFM., CAQSM., AME. Primary Care and Sports Medicine [facility]  Adjunct Professor of [employer]  Risk manager

## 2022-06-25 NOTE — Assessment & Plan Note (Signed)
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 We did an injection at the last visit she returns today doing really well, her pain is gone if she wears her brace and takes an occasional Tylenol. She has not yet started meloxicam, she understands this can continue to be an option if she has persistent or worsening discomfort. Return to see me as needed for this.

## 2022-06-28 ENCOUNTER — Ambulatory Visit: Payer: BC Managed Care – PPO | Admitting: Family Medicine

## 2022-06-28 NOTE — Progress Notes (Deleted)
   Established Patient Office Visit  Subjective   Patient ID: Kim Simpson, female    DOB: 09/26/1986  Age: 36 y.o. MRN: [id number]  No chief complaint on file.   HPI  {History (Optional):23778}  ROS    Objective:     LMP 09/17/2014  {Vitals History (Optional):23777}  Physical Exam   No results found for any visits on 06/28/22.  {Labs (Optional):23779}  The ASCVD Risk score (Arnett DK, et al., 2019) failed to calculate for the following reasons:   The 2019 ASCVD risk score is only valid for ages 21 to 79    Assessment & Plan:   Problem List Items Addressed This Visit   None   No follow-ups on file.    Owens Loffler, DO

## 2022-06-29 ENCOUNTER — Ambulatory Visit (INDEPENDENT_AMBULATORY_CARE_PROVIDER_SITE_OTHER): Payer: BC Managed Care – PPO

## 2022-06-29 ENCOUNTER — Other Ambulatory Visit: Payer: Self-pay | Admitting: Family Medicine

## 2022-06-29 ENCOUNTER — Ambulatory Visit: Payer: BC Managed Care – PPO

## 2022-06-29 ENCOUNTER — Encounter: Payer: Self-pay | Admitting: Family Medicine

## 2022-06-29 DIAGNOSIS — Q8589 Other phakomatoses, not elsewhere classified: Secondary | ICD-10-CM | POA: Diagnosis not present

## 2022-06-29 DIAGNOSIS — R140 Abdominal distension (gaseous): Secondary | ICD-10-CM | POA: Diagnosis not present

## 2022-06-29 DIAGNOSIS — N281 Cyst of kidney, acquired: Secondary | ICD-10-CM | POA: Diagnosis not present

## 2022-06-29 DIAGNOSIS — C50919 Malignant neoplasm of unspecified site of unspecified female breast: Secondary | ICD-10-CM | POA: Diagnosis not present

## 2022-06-29 DIAGNOSIS — J301 Allergic rhinitis due to pollen: Secondary | ICD-10-CM

## 2022-06-29 MED ORDER — CETIRIZINE HCL 10 MG PO TABS: 10.0000 mg | ORAL_TABLET | Freq: Every day | ORAL | 0 refills | Status: DC

## 2022-06-29 MED ORDER — GADOBUTROL 1 MMOL/ML IV SOLN
10.0000 mL | Freq: Once | INTRAVENOUS | Status: AC | PRN
  Administered 2022-06-29: 10 mL via INTRAVENOUS

## 2022-06-30 ENCOUNTER — Encounter: Payer: Self-pay | Admitting: *Deleted

## 2022-06-30 NOTE — Progress Notes (Signed)
Both patient's MRI reviewed. No acute findings on either.   Oncology Nurse Navigator Documentation     06/30/2022   11:00 AM  Oncology Nurse Navigator Flowsheets  Navigator Location [facility]  Navigator Encounter Type Scan Review  Patient Visit Type MedOnc  Treatment Phase Post-Tx Follow-up  Barriers/Navigation Needs Coordination of Care;Education  Interventions None Required  Acuity Level 2-Minimal Needs (1-2 Barriers Identified)  Support Groups/Services Friends and Family  Time Spent with Patient 15

## 2022-07-01 ENCOUNTER — Encounter: Payer: Self-pay | Admitting: *Deleted

## 2022-07-01 NOTE — Progress Notes (Signed)
[name], MD  P Onc Nurse Hp Call - the MRI does NOT show any cancer!!!!  Kim Simpson    Patient called and message left on voicemail. Also requested that patient update any progress made on getting medical records sent to [facility] so they can schedule a new patient appointment.   Oncology Nurse Navigator Documentation     07/01/2022    8:45 AM  Oncology Nurse Navigator Flowsheets  Navigator Location [facility]  Navigator Encounter Type Telephone  Telephone Diagnostic Results;Outgoing Call  Patient Visit Type MedOnc  Treatment Phase Post-Tx Follow-up  Barriers/Navigation Needs Coordination of Care;Education  Education Other  Interventions Education  Acuity Level 2-Minimal Needs (1-2 Barriers Identified)  Education Method Verbal  Support Groups/Services Friends and Family  Time Spent with Patient 15

## 2022-07-02 DIAGNOSIS — M25571 Pain in right ankle and joints of right foot: Secondary | ICD-10-CM | POA: Diagnosis not present

## 2022-07-07 DIAGNOSIS — Z853 Personal history of malignant neoplasm of breast: Secondary | ICD-10-CM | POA: Diagnosis not present

## 2022-07-14 ENCOUNTER — Ambulatory Visit (INDEPENDENT_AMBULATORY_CARE_PROVIDER_SITE_OTHER): Payer: BC Managed Care – PPO

## 2022-07-14 DIAGNOSIS — M816 Localized osteoporosis [Lequesne]: Secondary | ICD-10-CM | POA: Diagnosis not present

## 2022-07-14 DIAGNOSIS — M810 Age-related osteoporosis without current pathological fracture: Secondary | ICD-10-CM | POA: Diagnosis not present

## 2022-07-16 DIAGNOSIS — L814 Other melanin hyperpigmentation: Secondary | ICD-10-CM | POA: Diagnosis not present

## 2022-07-16 DIAGNOSIS — N9089 Other specified noninflammatory disorders of vulva and perineum: Secondary | ICD-10-CM | POA: Diagnosis not present

## 2022-07-16 DIAGNOSIS — D2372 Other benign neoplasm of skin of left lower limb, including hip: Secondary | ICD-10-CM | POA: Diagnosis not present

## 2022-07-16 DIAGNOSIS — B079 Viral wart, unspecified: Secondary | ICD-10-CM | POA: Diagnosis not present

## 2022-07-16 DIAGNOSIS — B353 Tinea pedis: Secondary | ICD-10-CM | POA: Diagnosis not present

## 2022-07-16 DIAGNOSIS — Z1283 Encounter for screening for malignant neoplasm of skin: Secondary | ICD-10-CM | POA: Diagnosis not present

## 2022-07-16 DIAGNOSIS — D229 Melanocytic nevi, unspecified: Secondary | ICD-10-CM | POA: Diagnosis not present

## 2022-08-09 ENCOUNTER — Encounter (INDEPENDENT_AMBULATORY_CARE_PROVIDER_SITE_OTHER): Payer: BC Managed Care – PPO | Admitting: Sports Medicine

## 2022-08-09 ENCOUNTER — Ambulatory Visit
Admission: EM | Admit: 2022-08-09 | Discharge: 2022-08-09 | Discharge status: Absent without leave | Payer: BC Managed Care – PPO

## 2022-08-09 DIAGNOSIS — G8929 Other chronic pain: Secondary | ICD-10-CM

## 2022-08-09 DIAGNOSIS — M25571 Pain in right ankle and joints of right foot: Secondary | ICD-10-CM

## 2022-08-09 NOTE — Telephone Encounter (Signed)
I spent 5 total minutes of online digital evaluation and management services in this patient-initiated request for online care. 

## 2022-08-10 ENCOUNTER — Ambulatory Visit (INDEPENDENT_AMBULATORY_CARE_PROVIDER_SITE_OTHER): Payer: BC Managed Care – PPO

## 2022-08-10 DIAGNOSIS — M84374A Stress fracture, right foot, initial encounter for fracture: Secondary | ICD-10-CM | POA: Diagnosis not present

## 2022-08-10 DIAGNOSIS — M25571 Pain in right ankle and joints of right foot: Secondary | ICD-10-CM

## 2022-08-10 DIAGNOSIS — G8929 Other chronic pain: Secondary | ICD-10-CM | POA: Diagnosis not present

## 2022-08-12 ENCOUNTER — Telehealth: Payer: Self-pay

## 2022-08-12 NOTE — Telephone Encounter (Signed)
Pt called to get the results of her right foot xrays she had done 2 days ago. The final report is still not available. Call to [facility] to check on the status. They stated that an upgrade occurred last night in Epic and this is causing some delays in getting the reports to flip over into Epic. Per staff, it was read as no acute abnormalities. Please review images.

## 2022-08-18 DIAGNOSIS — M25571 Pain in right ankle and joints of right foot: Secondary | ICD-10-CM | POA: Diagnosis not present

## 2022-08-27 ENCOUNTER — Telehealth: Payer: Self-pay

## 2022-08-27 NOTE — Telephone Encounter (Signed)
Pt is calling to inquire about the forms she dropped off for completion.   Also, pt inquired about having a bladder infection and wanted to know if she can just get an antibiotic called in.

## 2022-08-30 NOTE — Telephone Encounter (Signed)
Called and scheduled patient for 11/15 at [facility]

## 2022-09-01 ENCOUNTER — Encounter: Payer: Self-pay | Admitting: Family Medicine

## 2022-09-01 ENCOUNTER — Ambulatory Visit (INDEPENDENT_AMBULATORY_CARE_PROVIDER_SITE_OTHER): Payer: BC Managed Care – PPO | Admitting: Family Medicine

## 2022-09-01 VITALS — BP 110/81 | HR 88 | Ht 65.0 in | Wt 211.0 lb

## 2022-09-01 DIAGNOSIS — R300 Dysuria: Secondary | ICD-10-CM

## 2022-09-01 DIAGNOSIS — N3000 Acute cystitis without hematuria: Secondary | ICD-10-CM

## 2022-09-01 DIAGNOSIS — B070 Plantar wart: Secondary | ICD-10-CM | POA: Diagnosis not present

## 2022-09-01 LAB — POCT URINALYSIS DIP (CLINITEK)
Bilirubin, UA: NEGATIVE
Blood, UA: NEGATIVE
Glucose, UA: NEGATIVE mg/dL
Ketones, POC UA: NEGATIVE mg/dL
Nitrite, UA: NEGATIVE
POC PROTEIN,UA: NEGATIVE
Spec Grav, UA: >=1.03 — AB (ref 1.010–1.025)
Urobilinogen, UA: 0.2 E.U./dL
pH, UA: 6.5 (ref 5.0–8.0)

## 2022-09-01 MED ORDER — CLOBETASOL PROP EMOLLIENT BASE 0.05 % EX CREA: 1.0000 g | TOPICAL_CREAM | Freq: Two times a day (BID) | CUTANEOUS | 0 refills | Status: AC

## 2022-09-01 MED ORDER — NITROFURANTOIN MONOHYD MACRO 100 MG PO CAPS: 100.0000 mg | ORAL_CAPSULE | Freq: Two times a day (BID) | ORAL | 0 refills | Status: AC

## 2022-09-01 NOTE — Progress Notes (Signed)
Established patient visit   Patient: Kim Simpson   DOB: 1986/08/29   36 y.o. Female  MRN: [id number] Visit Date: 09/01/2022  Today's healthcare provider: Owens Loffler, DO   Chief Complaint  Patient presents with   Urinary Tract Infection    SUBJECTIVE    Chief Complaint  Patient presents with   Urinary Tract Infection   Urinary Tract Infection     Pt presents for form to be filled out for work.   She is also here for UTI like symptoms. She admits to dysuria. She is unsure if this is related to her lichen sclerosis.   Review of Systems  Constitutional:  Negative for activity change, fatigue and fever.  Respiratory:  Negative for cough and shortness of breath.   Cardiovascular:  Negative for chest pain.  Gastrointestinal:  Negative for abdominal pain.  Genitourinary:  Positive for dysuria. Negative for difficulty urinating.       Current Meds  Medication Sig   cetirizine (ZYRTEC) 10 MG tablet Take 1 tablet (10 mg total) by mouth daily.   Clobetasol Prop Emollient Base (CLOBETASOL PROPIONATE E) 0.05 % emollient cream Apply 2 Applications topically 2 (two) times daily.   meloxicam (MOBIC) 15 MG tablet Take 1 tablet by mouth daily.   nitrofurantoin, macrocrystal-monohydrate, (MACROBID) 100 MG capsule Take 1 capsule (100 mg total) by mouth 2 (two) times daily for 7 days.   [DISCONTINUED] clobetasol ointment (TEMOVATE) 1.74 % Apply 1 Application topically 2 (two) times daily.   [DISCONTINUED] clotrimazole-betamethasone (LOTRISONE) cream Apply 1 Application topically daily.   [DISCONTINUED] hydrocortisone cream 0.5 % Apply 1 Application topically 1 day or 1 dose.    OBJECTIVE    BP 110/81   Pulse 88   Ht '5\' 5"'$  (1.651 m)   Wt 211 lb (95.7 kg)   LMP 09/17/2014   SpO2 99%   BMI 35.11 kg/m   Physical Exam Vitals and nursing note reviewed.  Constitutional:      General: She is not in acute distress.    Appearance: Normal appearance.  HENT:     Head:  Normocephalic and atraumatic.     Right Ear: External ear normal.     Left Ear: External ear normal.     Nose: Nose normal.  Eyes:     Conjunctiva/sclera: Conjunctivae normal.  Cardiovascular:     Rate and Rhythm: Normal rate.  Pulmonary:     Effort: Pulmonary effort is normal.  Neurological:     General: No focal deficit present.     Mental Status: She is alert and oriented to person, place, and time.  Psychiatric:        Mood and Affect: Mood normal.        Behavior: Behavior normal.        Thought Content: Thought content normal.        Judgment: Judgment normal.          ASSESSMENT & PLAN    Problem List Items Addressed This Visit       Musculoskeletal and Integument   Plantar wart    - performed cryotherapy on two lesions on hand - provided follow up instructions - pt tolerated procedure well      Relevant Medications   nitrofurantoin, macrocrystal-monohydrate, (MACROBID) 100 MG capsule     Genitourinary   Acute cystitis without hematuria    - poc UA shows positive leuks  - will go ahead and treat for acute cystitis with macrobid - will culture  urine in case we need to change abx therapy      Relevant Medications   nitrofurantoin, macrocrystal-monohydrate, (MACROBID) 100 MG capsule   Other Relevant Orders   Urine Culture   Other Visit Diagnoses     Dysuria    -  Primary   Relevant Orders   Urine Culture       No follow-ups on file.      Meds ordered this encounter  Medications   Clobetasol Prop Emollient Base (CLOBETASOL PROPIONATE E) 0.05 % emollient cream    Sig: Apply 2 Applications topically 2 (two) times daily.    Dispense:  30 g    Refill:  0   nitrofurantoin, macrocrystal-monohydrate, (MACROBID) 100 MG capsule    Sig: Take 1 capsule (100 mg total) by mouth 2 (two) times daily for 7 days.    Dispense:  14 capsule    Refill:  0    Orders Placed This Encounter  Procedures   Urine Culture     Owens Loffler, [place] [phone] (phone) [phone] (fax)  [place]

## 2022-09-01 NOTE — Addendum Note (Signed)
Addended by: Cline Crock on: 09/01/2022 05:15 PM   Modules accepted: Orders

## 2022-09-01 NOTE — Assessment & Plan Note (Signed)
-   performed cryotherapy on two lesions on hand - provided follow up instructions - pt tolerated procedure well

## 2022-09-01 NOTE — Assessment & Plan Note (Signed)
-   poc UA shows positive leuks  - will go ahead and treat for acute cystitis with macrobid - will culture urine in case we need to change abx therapy

## 2022-09-03 LAB — URINE CULTURE
MICRO NUMBER:: 14197379
SPECIMEN QUALITY:: ADEQUATE

## 2022-09-30 DIAGNOSIS — Z20822 Contact with and (suspected) exposure to covid-19: Secondary | ICD-10-CM | POA: Diagnosis not present

## 2022-09-30 DIAGNOSIS — J069 Acute upper respiratory infection, unspecified: Secondary | ICD-10-CM | POA: Diagnosis not present

## 2022-09-30 DIAGNOSIS — R059 Cough, unspecified: Secondary | ICD-10-CM | POA: Diagnosis not present

## 2022-09-30 DIAGNOSIS — J029 Acute pharyngitis, unspecified: Secondary | ICD-10-CM | POA: Diagnosis not present

## 2022-10-22 DIAGNOSIS — L249 Irritant contact dermatitis, unspecified cause: Secondary | ICD-10-CM | POA: Insufficient documentation

## 2022-10-22 DIAGNOSIS — N904 Leukoplakia of vulva: Secondary | ICD-10-CM | POA: Diagnosis not present

## 2022-11-03 DIAGNOSIS — U071 COVID-19: Secondary | ICD-10-CM | POA: Diagnosis not present

## 2022-11-03 DIAGNOSIS — R059 Cough, unspecified: Secondary | ICD-10-CM | POA: Diagnosis not present

## 2022-11-16 ENCOUNTER — Telehealth: Payer: Self-pay

## 2022-11-16 DIAGNOSIS — N904 Leukoplakia of vulva: Secondary | ICD-10-CM | POA: Insufficient documentation

## 2022-11-16 NOTE — Telephone Encounter (Signed)
Referral placed.

## 2022-11-16 NOTE — Telephone Encounter (Signed)
Routing to covering provider.   Patient left a vm msg requesting a GYN Oncology referral for [facility] to see Dr. Genia Del located in [place].

## 2022-11-16 NOTE — Assessment & Plan Note (Signed)
Seen by dermatology, recommended that she touch base with [facility] as well. Referral placed.

## 2022-11-17 NOTE — Telephone Encounter (Signed)
Received incoming call from [facility] stating that patients referral needs to be sent to general GYN provider based on clinical review of referral. Please advise.

## 2022-11-17 NOTE — Telephone Encounter (Signed)
Okay just have the patient pick a provider and I will do the referral.

## 2022-11-17 NOTE — Telephone Encounter (Signed)
Left a detailed vm msg for patient regarding GYN referral has been placed. Direct call back info provided if patient has any inquiries.

## 2022-11-18 NOTE — Telephone Encounter (Signed)
Left message on patients vmail to contact the office with provider preference for GYN.

## 2022-12-10 ENCOUNTER — Inpatient Hospital Stay: Payer: BC Managed Care – PPO | Admitting: Hematology & Oncology

## 2022-12-10 ENCOUNTER — Inpatient Hospital Stay: Payer: BC Managed Care – PPO | Attending: Hematology & Oncology

## 2022-12-10 ENCOUNTER — Encounter: Payer: Self-pay | Admitting: Hematology & Oncology

## 2022-12-10 ENCOUNTER — Inpatient Hospital Stay: Payer: BC Managed Care – PPO

## 2022-12-10 VITALS — BP 134/89 | HR 97 | Temp 98.2°F | Resp 17 | Wt 220.0 lb

## 2022-12-10 DIAGNOSIS — C50812 Malignant 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: Secondary | ICD-10-CM

## 2022-12-10 DIAGNOSIS — Q8589 Other phakomatoses, not elsewhere classified: Secondary | ICD-10-CM | POA: Insufficient documentation

## 2022-12-10 DIAGNOSIS — Z853 Personal history of malignant neoplasm of breast: Secondary | ICD-10-CM | POA: Insufficient documentation

## 2022-12-10 DIAGNOSIS — Z9013 Acquired absence of bilateral breasts and nipples: Secondary | ICD-10-CM | POA: Insufficient documentation

## 2022-12-10 DIAGNOSIS — N3000 Acute cystitis without hematuria: Secondary | ICD-10-CM

## 2022-12-10 DIAGNOSIS — Z8719 Personal history of other diseases of the digestive system: Secondary | ICD-10-CM | POA: Insufficient documentation

## 2022-12-10 DIAGNOSIS — Z9221 Personal history of antineoplastic chemotherapy: Secondary | ICD-10-CM | POA: Diagnosis not present

## 2022-12-10 LAB — CBC WITH DIFFERENTIAL (CANCER CENTER ONLY)
Abs Immature Granulocytes: 0.04 10*3/uL (ref 0.00–0.07)
Basophils Absolute: 0.1 10*3/uL (ref 0.0–0.1)
Basophils Relative: 1 %
Eosinophils Absolute: 0.1 10*3/uL (ref 0.0–0.5)
Eosinophils Relative: 1 %
HCT: 39.1 % (ref 36.0–46.0)
Hemoglobin: 13.1 g/dL (ref 12.0–15.0)
Immature Granulocytes: 1 %
Lymphocytes Relative: 34 %
Lymphs Abs: 2.5 10*3/uL (ref 0.7–4.0)
MCH: 30.2 pg (ref 26.0–34.0)
MCHC: 33.5 g/dL (ref 30.0–36.0)
MCV: 90.1 fL (ref 80.0–100.0)
Monocytes Absolute: 0.6 10*3/uL (ref 0.1–1.0)
Monocytes Relative: 8 %
Neutro Abs: 4.3 10*3/uL (ref 1.7–7.7)
Neutrophils Relative %: 55 %
Platelet Count: 213 10*3/uL (ref 150–400)
RBC: 4.34 MIL/uL (ref 3.87–5.11)
RDW: 12.4 % (ref 11.5–15.5)
WBC Count: 7.5 10*3/uL (ref 4.0–10.5)
nRBC: 0 % (ref 0.0–0.2)

## 2022-12-10 LAB — CMP (CANCER CENTER ONLY)
ALT: 38 U/L (ref 0–44)
AST: 27 U/L (ref 15–41)
Albumin: 4.7 g/dL (ref 3.5–5.0)
Alkaline Phosphatase: 90 U/L (ref 38–126)
Anion gap: 9 (ref 5–15)
BUN: 11 mg/dL (ref 6–20)
CO2: 28 mmol/L (ref 22–32)
Calcium: 10.1 mg/dL (ref 8.9–10.3)
Chloride: 104 mmol/L (ref 98–111)
Creatinine: 0.68 mg/dL (ref 0.44–1.00)
GFR, Estimated: >60 mL/min (ref >60)
Glucose, Bld: 109 mg/dL — ABNORMAL HIGH (ref 70–99)
Potassium: 3.8 mmol/L (ref 3.5–5.1)
Sodium: 141 mmol/L (ref 135–145)
Total Bilirubin: 0.3 mg/dL (ref 0.3–1.2)
Total Protein: 7.4 g/dL (ref 6.5–8.1)

## 2022-12-10 LAB — URINALYSIS, COMPLETE (UACMP) WITH MICROSCOPIC
Bilirubin Urine: NEGATIVE
Glucose, UA: NEGATIVE mg/dL
Hgb urine dipstick: NEGATIVE
Ketones, ur: NEGATIVE mg/dL
Leukocytes,Ua: NEGATIVE
Nitrite: NEGATIVE
Protein, ur: NEGATIVE mg/dL
Specific Gravity, Urine: 1.02 (ref 1.005–1.030)
pH: 7 (ref 5.0–8.0)

## 2022-12-10 LAB — RETICULOCYTES
Immature Retic Fract: 9.5 % (ref 2.3–15.9)
RBC.: 4.36 MIL/uL (ref 3.87–5.11)
Retic Count, Absolute: 99.8 10*3/uL (ref 19.0–186.0)
Retic Ct Pct: 2.3 % (ref 0.4–3.1)

## 2022-12-10 LAB — FERRITIN: Ferritin: 51 ng/mL (ref 11–307)

## 2022-12-11 LAB — CANCER ANTIGEN 27.29: CA 27.29: 32.8 U/mL (ref 0.0–38.6)

## 2022-12-13 ENCOUNTER — Telehealth: Payer: Self-pay

## 2022-12-13 ENCOUNTER — Encounter: Payer: Self-pay | Admitting: Sports Medicine

## 2022-12-13 ENCOUNTER — Inpatient Hospital Stay: Payer: BC Managed Care – PPO

## 2022-12-13 DIAGNOSIS — Z9221 Personal history of antineoplastic chemotherapy: Secondary | ICD-10-CM | POA: Diagnosis not present

## 2022-12-13 DIAGNOSIS — Z9013 Acquired absence of bilateral breasts and nipples: Secondary | ICD-10-CM | POA: Diagnosis not present

## 2022-12-13 DIAGNOSIS — Z8719 Personal history of other diseases of the digestive system: Secondary | ICD-10-CM | POA: Diagnosis not present

## 2022-12-13 DIAGNOSIS — Q8589 Other phakomatoses, not elsewhere classified: Secondary | ICD-10-CM | POA: Diagnosis not present

## 2022-12-13 DIAGNOSIS — Z853 Personal history of malignant neoplasm of breast: Secondary | ICD-10-CM | POA: Diagnosis not present

## 2022-12-13 DIAGNOSIS — N3000 Acute cystitis without hematuria: Secondary | ICD-10-CM

## 2022-12-13 LAB — IRON AND IRON BINDING CAPACITY (CC-WL,HP ONLY)
Iron: 50 ug/dL (ref 28–170)
Saturation Ratios: 15 % (ref 10.4–31.8)
TIBC: 339 ug/dL (ref 250–450)
UIBC: 289 ug/dL (ref 148–442)

## 2022-12-13 NOTE — Telephone Encounter (Signed)
See My chart message

## 2022-12-14 ENCOUNTER — Ambulatory Visit (INDEPENDENT_AMBULATORY_CARE_PROVIDER_SITE_OTHER): Payer: BC Managed Care – PPO | Admitting: Medical-Surgical

## 2022-12-14 ENCOUNTER — Telehealth: Payer: Self-pay

## 2022-12-14 ENCOUNTER — Encounter: Payer: Self-pay | Admitting: Medical-Surgical

## 2022-12-14 ENCOUNTER — Ambulatory Visit: Payer: BC Managed Care – PPO

## 2022-12-14 VITALS — BP 121/80 | HR 102 | Resp 20 | Ht 65.0 in | Wt 222.2 lb

## 2022-12-14 DIAGNOSIS — S91331A Puncture wound without foreign body, right foot, initial encounter: Secondary | ICD-10-CM

## 2022-12-14 LAB — URINE CULTURE: Culture: NO GROWTH

## 2022-12-14 MED ORDER — DOXYCYCLINE HYCLATE 100 MG PO TABS: 100.0000 mg | ORAL_TABLET | Freq: Two times a day (BID) | ORAL | 0 refills | Status: AC

## 2022-12-14 NOTE — Progress Notes (Signed)
Established Patient Office Visit  Subjective   Patient ID: Kim Simpson, female   DOB: 03-Nov-1985 Age: 37 y.o. MRN: [id number]   Chief Complaint  Patient presents with   Foot Injury    RIGHT FOOT WOUND    HPI Pleasant 37 year old female presenting today with reports of a right foot wound.  Approximately 1 week ago, she was walking around barefoot.  She stepped on something that she thought may have been a Christmas ornament hook that had fallen into the carpet when they took their Christmas tree down.  When she investigated further, she noted that there was a piece of something that appeared to be plastic stuck in the side of her foot.  She was able to get this out without difficulty.  The area did bleed at the time but stopped quickly.  She has been monitoring the site although it is an area that is difficult to see.  Notes that it seemed to close over okay but over the last couple of days, it felt like there was something still stuck in the wound.  She has been soaking her foot once daily in Epsom salt water.   Objective:    Vitals:   12/14/22 1520  BP: 121/80  Pulse: (!) 102  Resp: 20  Height: '5\' 5"'$  (1.651 m)  Weight: 222 lb 3.2 oz (100.8 kg)  SpO2: 97%  BMI (Calculated): 36.98    Physical Exam Vitals reviewed.  Constitutional:      General: She is not in acute distress.    Appearance: Normal appearance. She is not ill-appearing.  HENT:     Head: Normocephalic and atraumatic.  Cardiovascular:     Rate and Rhythm: Normal rate and regular rhythm.     Pulses: Normal pulses.     Heart sounds: Normal heart sounds.  Pulmonary:     Effort: Pulmonary effort is normal. No respiratory distress.     Breath sounds: Normal breath sounds. No wheezing, rhonchi or rales.  Musculoskeletal:       Feet:  Skin:    General: Skin is warm and dry.  Neurological:     Mental Status: She is alert and oriented to person, place, and time.  Psychiatric:        Mood and Affect: Mood normal.         Behavior: Behavior normal.        Thought Content: Thought content normal.        Judgment: Judgment normal.   No results found for this or any previous visit (from the past 24 hour(s)).     The ASCVD Risk score (Arnett DK, et al., 2019) failed to calculate for the following reasons:   The 2019 ASCVD risk score is only valid for ages 37 to 70   Assessment & Plan:   1. Penetrating wound of right foot, initial encounter On evaluation of the wound, unable to palpate or visualize any further foreign object.  Recommend increasing Epsom salt soaks to at least 10 to 15 minutes 3 times daily over the next several days.  After soaking, consider using a Betadine wash over the affected area.  Adding doxycycline 100 mg twice daily x 7 days as there does appear to be purulent drainage visible.  Recommend updating tetanus as her last one was approximately 10 years ago.  We do not have Td in our office and she had an encephalopathy reaction to pertussis in the past.  Advised her to contact her pharmacy as they will  likely be able to administer the update there.  Advised to let us know if she gets her vaccine updated so we can update her records.  Return if symptoms worsen or fail to improve.  ___________________________________________ Clearnce Sorrel, DNP, APRN, FNP-BC Primary Care and [name]

## 2022-12-14 NOTE — Telephone Encounter (Signed)
-----   Message from [name], MD sent at 12/14/2022  1:59 PM EST ----- Please call and let her know that the urine culture that we drew was negative.  Thanks

## 2022-12-17 ENCOUNTER — Encounter: Payer: Self-pay | Admitting: *Deleted

## 2022-12-17 NOTE — Progress Notes (Signed)
Patient is currently under observation/surveillance. She has had no navigational needs. As such, I will discontinue active navigation but be available to the patient as needed in the future.  Oncology Nurse Navigator Documentation     12/17/2022    8:15 AM  Oncology Nurse Navigator Flowsheets  Navigation Complete Date: 12/17/2022  Post Navigation: Continue to Follow Patient? No  Reason Not Navigating Patient: No Treatment, Observation Only  Navigator Location [facility]  Navigator Encounter Type Appt/Treatment Plan Review  Patient Visit Type MedOnc  Treatment Phase Post-Tx Follow-up  Barriers/Navigation Needs No Barriers At This Time  Interventions None Required  Acuity Level 1-No Barriers  Support Groups/Services Friends and Family  Time Spent with Patient 15

## 2022-12-17 NOTE — Progress Notes (Signed)
Hematology and Oncology Follow Up Visit  Kim Simpson [id number] August 01, 1986 37 y.o. 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  We first saw her back in August 2023.  At that time, she was sent over because of the Peutz Jeghers syndrome.  She had been followed at [facility] quite closely.  I think because of travel issues, it is likely that for her to come to the [place].  She does have the past history of her breast cancer.  This is of the LEFT breast.  She had been on Aromasin.  The big news is that she be going over to [place] I think in the summer.  She I think has parents who are missionaries over there.  I am sure that she will have a wonderful time.  She has had no health issues.  She has had no problems with bowels or bladder.  She has had no cough or shortness of breath.  There has been no change in bowel or bladder habits.  She has had no bleeding..  She recently saw dermatology.  She has had no issues with COVID.  She has had no bleeding.  She has had a oophorectomy.  Currently, I would say performance as a probably ECOG 1.  Medications:  Current Outpatient Medications:    acetaminophen (TYLENOL) 500 MG tablet, Take 1,000 mg by mouth every 6 (six) hours as needed., Disp: , Rfl:    Ascorbic Acid (VITAMIN C) 1000 MG tablet, Take 2,000 mg by mouth daily., Disp: , Rfl:    ATHLETES FOOT AF 1 % cream, Apply 1 Application topically 2 (two) times daily. As needed, Disp: , Rfl:    Clobetasol Prop Emollient Base (CLOBETASOL PROPIONATE E) 0.05 % emollient cream, Apply 2 Applications topically 2 (two) times daily., Disp: 30 g, Rfl: 0   Clobetasol Propionate 0.05 % lotion, Apply  topically 2 (two) times daily., Disp: , Rfl:    hydrocortisone 2.5 % ointment, Apply topically 2 (two) times a week. As needed, Disp: , Rfl:    imiquimod (ALDARA) 5 % cream, Apply topically at bedtime., Disp: , Rfl:    meloxicam (MOBIC) 15 MG tablet, Take 1 tablet by mouth daily., Disp: , Rfl:    TESTOSTERONE UNDECANOATE PO, Take 2.5 mg by mouth daily at 6 (six) AM., Disp: , Rfl:    VITAMIN D3 50 MCG (2000 UT) TABS, Take 1 tablet by mouth daily., Disp: , Rfl:    Zinc 50 MG TABS, Take 1 tablet by mouth daily., Disp: , Rfl:    cetirizine (ZYRTEC) 10 MG tablet, Take 1 tablet (10 mg total) by mouth daily. (Patient not taking: Reported on 12/10/2022), Disp: 90 tablet, Rfl: 0   doxycycline (VIBRA-TABS) 100 MG tablet, Take 1 tablet (100 mg total) by mouth 2 (two) times daily for 7 days., Disp: 14 tablet, Rfl: 0  Allergies:  Allergies  Allergen Reactions   Aspirin Other (See Comments)    Encephalitis as a toddler   Cephalexin Hives and Itching   Chlorine Itching, Dermatitis and Rash   Sulfamethoxazole-Trimethoprim Itching and Rash    Reports rash on tops of feet; itching/burning      Past Medical History, Surgical history, Social history, and Family History were reviewed  and updated.  Review of Systems: Review of Systems  Constitutional: Negative.   HENT:  Negative.    Eyes: Negative.   Respiratory: Negative.    Cardiovascular: Negative.   Gastrointestinal: Negative.   Endocrine: Negative.   Genitourinary: Negative.    Musculoskeletal: Negative.   Skin: Negative.   Neurological: Negative.   Hematological: Negative.   Psychiatric/Behavioral: Negative.      Physical Exam:  weight is 220 lb (99.8 kg). Her oral temperature is 98.2 F (36.8 C). Her blood pressure is 134/89 and her pulse is 97. Her respiration is 17 and oxygen saturation is 99%.   Wt Readings from Last 3 Encounters:  12/14/22 222 lb 3.2 oz (100.8 kg)  12/10/22 220 lb (99.8 kg)  09/01/22 211 lb (95.7 kg)     Physical Exam Vitals reviewed.  Constitutional:      Comments: She has had bilateral mastectomies.  There is no chest wall nodularity.  There is no erythema.  There is no tenderness.  There is no bilateral axillary adenopathy.  HENT:     Head: Normocephalic and atraumatic.  Eyes:     Pupils: Pupils are equal, round, and reactive to light.  Cardiovascular:     Rate and Rhythm: Normal rate and regular rhythm.     Heart sounds: Normal heart sounds.  Pulmonary:     Effort: Pulmonary effort is normal.     Breath sounds: Normal breath sounds.  Abdominal:     General: Bowel sounds are normal.     Palpations: Abdomen is soft.  Musculoskeletal:        General: No tenderness or deformity. Normal range of motion.     Cervical back: Normal range of motion.  Lymphadenopathy:     Cervical: No cervical adenopathy.  Skin:    General: Skin is warm and dry.     Findings: No erythema or rash.  Neurological:     Mental Status: She is alert and oriented to person, place, and time.  Psychiatric:        Behavior: Behavior normal.        Thought Content: Thought content normal.        Judgment: Judgment normal.      Lab Results  Component Value Date   WBC 7.5 12/10/2022   HGB 13.1 12/10/2022   HCT 39.1 12/10/2022   MCV 90.1 12/10/2022   PLT 213 12/10/2022     Chemistry      Component Value Date/Time   NA 141 12/10/2022 1526   K 3.8 12/10/2022 1526   CL 104 12/10/2022 1526   CO2 28 12/10/2022 1526   BUN 11 12/10/2022 1526   CREATININE 0.68 12/10/2022 1526      Component Value Date/Time   CALCIUM 10.1 12/10/2022 1526   ALKPHOS 90 12/10/2022 1526   AST 27 12/10/2022 1526   ALT 38 12/10/2022 1526   BILITOT 0.3 12/10/2022 1526       Impression and Plan: Kim Simpson is a very nice 37 year old white female.  She has the Peutz Jehgers syndrome.  She has had breast cancer..  She has had a colonic polyp.  This was back in 2014.  I think we had to be aggressive with her  surveillance.  I do think she probably needs to have scans done.  I also think she bodies have a colonoscopy done.  I am not sure when her last colonoscopy was.  Again I do not see any problems with her going over to [place].  I know she will have a good time.  I think the 1 question is what type of vaccinations she will need.  I am sure that her family doctor might be able to help her out with this.  I would like to get a CT scan on her before she goes over to [place].  We will get this in May.  I will plan to see [place]    [name], MD 3/1/20247:55 AM

## 2022-12-24 ENCOUNTER — Ambulatory Visit (INDEPENDENT_AMBULATORY_CARE_PROVIDER_SITE_OTHER): Payer: BC Managed Care – PPO | Admitting: Sports Medicine

## 2022-12-24 DIAGNOSIS — M25571 Pain in right ankle and joints of right foot: Secondary | ICD-10-CM | POA: Diagnosis not present

## 2022-12-24 DIAGNOSIS — G8929 Other chronic pain: Secondary | ICD-10-CM

## 2022-12-24 NOTE — Assessment & Plan Note (Signed)
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 We did an injection sometime in August of last year and she did really well, she does wear a brace and is happy with how things are going. She will do meloxicam as needed, Tylenol as needed, we will add some aggressive physical therapy. She can return to see me as needed.

## 2022-12-24 NOTE — Progress Notes (Signed)
    Procedures performed today:    None.  Independent interpretation of notes and tests performed by another provider:   None.  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 We did an injection sometime in August of last year and she did really well, she does wear a brace and is happy with how things are going. She will do meloxicam as needed, Tylenol as needed, we will add some aggressive physical therapy. She can return to see me as needed.    ____________________________________________ Kim Simpson, M.D., ABFM., CAQSM., AME. Primary Care and Sports Medicine [facility]  Adjunct Professor of [place] [employer]  Risk manager

## 2023-01-07 ENCOUNTER — Other Ambulatory Visit: Payer: Self-pay | Admitting: *Deleted

## 2023-01-07 ENCOUNTER — Telehealth: Payer: Self-pay | Admitting: *Deleted

## 2023-01-07 DIAGNOSIS — C50812 Malignant 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: Secondary | ICD-10-CM

## 2023-01-07 DIAGNOSIS — N3000 Acute cystitis without hematuria: Secondary | ICD-10-CM

## 2023-01-07 DIAGNOSIS — Q8589 Other phakomatoses, not elsewhere classified: Secondary | ICD-10-CM

## 2023-01-07 NOTE — Telephone Encounter (Signed)
Message received from patient requesting a GYN referral that Dr Marin Olp had mentioned sending at her last appt.  Call placed back to pt and message left to notify her that a referral would be sent to Dr. Stanton Kidney [facility] per order of Dr. Marin Olp.  Instructed pt to call office back with any questions.

## 2023-01-10 ENCOUNTER — Telehealth: Payer: Self-pay | Admitting: *Deleted

## 2023-01-10 NOTE — Telephone Encounter (Signed)
Faxed referral to Dr. Hale Bogus [facility] [phone]

## 2023-01-14 DIAGNOSIS — B353 Tinea pedis: Secondary | ICD-10-CM | POA: Diagnosis not present

## 2023-01-14 DIAGNOSIS — L309 Dermatitis, unspecified: Secondary | ICD-10-CM | POA: Diagnosis not present

## 2023-01-14 DIAGNOSIS — R32 Unspecified urinary incontinence: Secondary | ICD-10-CM | POA: Diagnosis not present

## 2023-01-20 DIAGNOSIS — E039 Hypothyroidism, unspecified: Secondary | ICD-10-CM | POA: Diagnosis not present

## 2023-01-20 DIAGNOSIS — E559 Vitamin D deficiency, unspecified: Secondary | ICD-10-CM | POA: Diagnosis not present

## 2023-01-20 DIAGNOSIS — H6123 Impacted cerumen, bilateral: Secondary | ICD-10-CM | POA: Diagnosis not present

## 2023-01-20 DIAGNOSIS — E782 Mixed hyperlipidemia: Secondary | ICD-10-CM | POA: Diagnosis not present

## 2023-01-20 DIAGNOSIS — R109 Unspecified abdominal pain: Secondary | ICD-10-CM | POA: Diagnosis not present

## 2023-01-20 DIAGNOSIS — E538 Deficiency of other specified B group vitamins: Secondary | ICD-10-CM | POA: Diagnosis not present

## 2023-01-20 DIAGNOSIS — E010 Iodine-deficiency related diffuse (endemic) goiter: Secondary | ICD-10-CM | POA: Diagnosis not present

## 2023-01-26 ENCOUNTER — Telehealth: Payer: BC Managed Care – PPO | Admitting: Nurse Practitioner

## 2023-01-26 DIAGNOSIS — J0100 Acute maxillary sinusitis, unspecified: Secondary | ICD-10-CM | POA: Diagnosis not present

## 2023-01-26 MED ORDER — BENZONATATE 100 MG PO CAPS: 100.0000 mg | ORAL_CAPSULE | Freq: Two times a day (BID) | ORAL | 0 refills | Status: DC | PRN

## 2023-01-26 MED ORDER — AMOXICILLIN-POT CLAVULANATE 875-125 MG PO TABS: 1.0000 | ORAL_TABLET | Freq: Two times a day (BID) | ORAL | 0 refills | Status: DC

## 2023-01-26 NOTE — Patient Instructions (Signed)
Kim Simpson, thank you for joining Bennie Pierini, FNP for today's virtual visit.  While this provider is not your primary care provider (PCP), if your PCP is located in our provider database this encounter information will be shared with them immediately following your visit.   A [facility] MyChart account gives you access to today's visit and all your visits, tests, and labs performed at [facility] " click here if you don't have a [facility] MyChart account or go to [email or web address]  Consent: (Patient) Kim Simpson provided verbal consent for this virtual visit at the beginning of the encounter.  Current Medications:  Current Outpatient Medications:    amoxicillin-clavulanate (AUGMENTIN) 875-125 MG tablet, Take 1 tablet by mouth 2 (two) times daily., Disp: 14 tablet, Rfl: 0   benzonatate (TESSALON) 100 MG capsule, Take 1 capsule (100 mg total) by mouth 2 (two) times daily as needed for cough., Disp: 20 capsule, Rfl: 0   acetaminophen (TYLENOL) 500 MG tablet, Take 1,000 mg by mouth every 6 (six) hours as needed., Disp: , Rfl:    Ascorbic Acid (VITAMIN C) 1000 MG tablet, Take 2,000 mg by mouth daily., Disp: , Rfl:    ATHLETES FOOT AF 1 % cream, Apply 1 Application topically 2 (two) times daily. As needed, Disp: , Rfl:    cetirizine (ZYRTEC) 10 MG tablet, Take 1 tablet (10 mg total) by mouth daily., Disp: 90 tablet, Rfl: 0   Clobetasol Prop Emollient Base (CLOBETASOL PROPIONATE E) 0.05 % emollient cream, Apply 2 Applications topically 2 (two) times daily., Disp: 30 g, Rfl: 0   Clobetasol Propionate 0.05 % lotion, Apply topically 2 (two) times daily., Disp: , Rfl:    hydrocortisone 2.5 % ointment, Apply topically 2 (two) times a week. As needed, Disp: , Rfl:    imiquimod (ALDARA) 5 % cream, Apply topically at bedtime., Disp: , Rfl:    meloxicam (MOBIC) 15 MG tablet, Take 1 tablet by mouth daily., Disp: , Rfl:    TESTOSTERONE UNDECANOATE PO, Take 2.5 mg by  mouth daily at 6 (six) AM., Disp: , Rfl:    VITAMIN D3 50 MCG (2000 UT) TABS, Take 1 tablet by mouth daily., Disp: , Rfl:    Zinc 50 MG TABS, Take 1 tablet by mouth daily., Disp: , Rfl:    Medications ordered in this encounter:  Meds ordered this encounter  Medications   benzonatate (TESSALON) 100 MG capsule    Sig: Take 1 capsule (100 mg total) by mouth 2 (two) times daily as needed for cough.    Dispense:  20 capsule    Refill:  0    Order Specific Question:   Supervising Provider    Answer:   Merrilee Jansky [id number]   amoxicillin-clavulanate (AUGMENTIN) 875-125 MG tablet    Sig: Take 1 tablet by mouth 2 (two) times daily.    Dispense:  14 tablet    Refill:  0    Order Specific Question:   Supervising Provider    Answer:   Merrilee Jansky [id number]     *If you need refills on other medications prior to your next appointment, please contact your pharmacy*  Follow-Up: Call back or seek an in-person evaluation if the symptoms worsen or if the condition fails to improve as anticipated.  [facility] [phone]  Other Instructions 1. Take meds as prescribed 2. Use a cool mist humidifier especially during the winter months and when heat has been humid. 3. Use saline nose sprays frequently  4. Saline irrigations of the nose can be very helpful if done frequently.  * 4X daily for 1 week*  * Use of a nettie pot can be helpful with this. Follow directions with this* 5. Drink plenty of fluids 6. Keep thermostat turn down low 7.For any cough or congestion- tessalon perls 8. For fever or aces or pains- take tylenol or ibuprofen appropriate for age and weight.  * for fevers greater than 101 orally you may alternate ibuprofen and tylenol every  3 hours.      If you have been instructed to have an in-person evaluation today at a local Urgent Care facility, please use the link below. It will take you to a list of all of our available [facility],  including address, phone number and hours of operation. Please do not delay care.  [facility]  If you or a family member do not have a primary care provider, use the link below to schedule a visit and establish care. When you choose a [facility] primary care physician or advanced practice provider, you gain a long-term partner in health. Find a Primary Care Provider  Learn more about [facility]'s in-office and virtual care options: [facility] - Get Care Now

## 2023-01-26 NOTE — Progress Notes (Signed)
Virtual Visit Consent   Kim Simpson, you are scheduled for a virtual visit with Kim Daphine Deutscher, FNP, a [facility] provider, today.     Just as with appointments in the office, your consent must be obtained to participate.  Your consent will be active for this visit and any virtual visit you may have with one of our providers in the next 365 days.     If you have a MyChart account, a copy of this consent can be sent to you electronically.  All virtual visits are billed to your insurance company just like a traditional visit in the office.    As this is a virtual visit, video technology does not allow for your provider to perform a traditional examination.  This may limit your provider's ability to fully assess your condition.  If your provider identifies any concerns that need to be evaluated in person or the need to arrange testing (such as labs, EKG, etc.), we will make arrangements to do so.     Although advances in technology are sophisticated, we cannot ensure that it will always work on either your end or our end.  If the connection with a video visit is poor, the visit may have to be switched to a telephone visit.  With either a video or telephone visit, we are not always able to ensure that we have a secure connection.     I need to obtain your verbal consent now.   Are you willing to proceed with your visit today? YES   Aretta Flasch has provided verbal consent on 01/26/2023 for a virtual visit (video or telephone).   Kim Daphine Deutscher, FNP   Date: 01/26/2023 3:23 PM   Virtual Visit via Video Note   I, Kim Simpson, connected with Kim Simpson ([id number], 07-16-1986) on 01/26/23 at  3:30 PM EDT by a video-enabled telemedicine application and verified that I am speaking with the correct person using two identifiers.  Location: Patient: Virtual Visit Location Patient: Home Provider: Virtual Visit Location Provider: Mobile   I discussed the limitations of  evaluation and management by telemedicine and the availability of in person appointments. The patient expressed understanding and agreed to proceed.    History of Present Illness: Kim Simpson is a 37 y.o. who identifies as a female who was assigned female at birth, and is being seen today for uri .  HPI: URI  This is a new problem. The problem has been waxing and waning. Associated symptoms include congestion, coughing, headaches, neck pain and rhinorrhea. Treatments tried: dayquil and nyquil.    Review of Systems  HENT:  Positive for congestion and rhinorrhea.   Respiratory:  Positive for cough.   Musculoskeletal:  Positive for neck pain.  Neurological:  Positive for headaches.    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   Chronic pain of right ankle 02/04/2022   Osteoporosis 02/04/2022   MRI of brain abnormal 07/11/2019   Generalized headaches 05/22/2019   Sinus tachycardia 05/03/2019   Hypersomnia 12/21/2018   Herpes zoster without complication 04/12/2018   Obesity, unspecified 12/07/2017   Neuropathy 12/10/2015   Polyneuropathy 12/10/2015  Malignant 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 07/23/2015    Allergies:  Allergies  Allergen Reactions   Aspirin Other (See Comments)    Encephalitis as a toddler   Cephalexin Hives and Itching   Chlorine Itching, Dermatitis and Rash   Sulfamethoxazole-Trimethoprim Itching and Rash    Reports rash on tops of feet; itching/burning     Medications:  Current Outpatient Medications:    acetaminophen (TYLENOL) 500 MG tablet, Take 1,000 mg by mouth every 6  (six) hours as needed., Disp: , Rfl:    Ascorbic Acid (VITAMIN C) 1000 MG tablet, Take 2,000 mg by mouth daily., Disp: , Rfl:    ATHLETES FOOT AF 1 % cream, Apply 1 Application topically 2 (two) times daily. As needed, Disp: , Rfl:    cetirizine (ZYRTEC) 10 MG tablet, Take 1 tablet (10 mg total) by mouth daily., Disp: 90 tablet, Rfl: 0   Clobetasol Prop Emollient Base (CLOBETASOL PROPIONATE E) 0.05 % emollient cream, Apply 2 Applications topically 2 (two) times daily., Disp: 30 g, Rfl: 0   Clobetasol Propionate 0.05 % lotion, Apply topically 2 (two) times daily., Disp: , Rfl:    hydrocortisone 2.5 % ointment, Apply topically 2 (two) times a week. As needed, Disp: , Rfl:    imiquimod (ALDARA) 5 % cream, Apply topically at bedtime., Disp: , Rfl:    meloxicam (MOBIC) 15 MG tablet, Take 1 tablet by mouth daily., Disp: , Rfl:    TESTOSTERONE UNDECANOATE PO, Take 2.5 mg by mouth daily at 6 (six) AM., Disp: , Rfl:    VITAMIN D3 50 MCG (2000 UT) TABS, Take 1 tablet by mouth daily., Disp: , Rfl:    Zinc 50 MG TABS, Take 1 tablet by mouth daily., Disp: , Rfl:   Observations/Objective: Patient is well-developed, well-nourished in no acute distress.  Resting comfortably  at home.  Head is normocephalic, atraumatic.  No labored breathing.  Speech is clear and coherent with logical content.  Patient is alert and oriented at baseline.  Raspy voice Maxillary sinus pressure bil  Assessment and Plan:  Kim Simpson in today with chief complaint of No chief complaint on file.   1. Acute non-recurrent maxillary sinusitis 1. Take meds as prescribed 2. Use a cool mist humidifier especially during the winter months and when heat has been humid. 3. Use saline nose sprays frequently 4. Saline irrigations of the nose can be very helpful if done frequently.  * 4X daily for 1 week*  * Use of a nettie pot can be helpful with this. Follow directions with this* 5. Drink plenty of fluids 6. Keep thermostat  turn down low 7.For any cough or congestion- tessalon perles 8. For fever or aces or pains- take tylenol or ibuprofen appropriate for age and weight.  * for fevers greater than 101 orally you may alternate ibuprofen and tylenol every  3 hours.   Meds ordered this encounter  Medications   benzonatate (TESSALON) 100 MG capsule    Sig: Take 1 capsule (100 mg total) by mouth 2 (two) times daily as needed for cough.    Dispense:  20 capsule    Refill:  0    Order Specific Question:   Supervising Provider    Answer:   Merrilee Jansky [id number]   amoxicillin-clavulanate (AUGMENTIN) 875-125 MG tablet    Sig: Take 1 tablet by mouth 2 (two) times daily.    Dispense:  14 tablet    Refill:  0    Order  Specific Question:   Supervising Provider    Answer:   LAMPTEY, PHILIP O [[id number] Jansky][[id number]]       Follow Up Instructions: I discussed the assessment and treatment plan with the patient. The patient was provided an opportunity to ask questions and all were answered. The patient agreed with the plan and demonstrated an understanding of the instructions.  A copy of instructions were sent to the patient via MyChart.  The patient was advised to call back or seek an in-person evaluation if the symptoms worsen or if the condition fails to improve as anticipated.  Time:  I spent  7  with the patient via telehealth technology discussing the above problems/concerns.    Kim Daphine DeutscherMartin, FNP vdeo

## 2023-02-23 ENCOUNTER — Other Ambulatory Visit: Payer: Self-pay

## 2023-02-23 ENCOUNTER — Telehealth: Payer: Self-pay

## 2023-02-23 DIAGNOSIS — Q8589 Other phakomatoses, not elsewhere classified: Secondary | ICD-10-CM

## 2023-02-23 NOTE — Telephone Encounter (Signed)
Received call from pt questioning  if Dr Myna Hidalgo would be comfortable with her pursuing care with [facility] and if he would refer.  Per Dr Myna Hidalgo, typically [facility] doesn't require a referral but he is ok with her getting f/u there.  This information left on pt's VM.  Pt called back and left additional message that she had spoken with [facility] in [place] and that they do require a referral since she is not an active oncology pt. Referral place. Bjorn Loser, scheduler notified. dph

## 2023-02-28 ENCOUNTER — Telehealth: Payer: Self-pay | Admitting: *Deleted

## 2023-02-28 NOTE — Telephone Encounter (Signed)
Faxed referral to [facility] [phone]

## 2023-04-01 DIAGNOSIS — E559 Vitamin D deficiency, unspecified: Secondary | ICD-10-CM | POA: Diagnosis not present

## 2023-04-01 DIAGNOSIS — R5383 Other fatigue: Secondary | ICD-10-CM | POA: Diagnosis not present

## 2023-04-01 DIAGNOSIS — Z131 Encounter for screening for diabetes mellitus: Secondary | ICD-10-CM | POA: Diagnosis not present

## 2023-04-01 DIAGNOSIS — Z1329 Encounter for screening for other suspected endocrine disorder: Secondary | ICD-10-CM | POA: Diagnosis not present

## 2023-04-01 DIAGNOSIS — E8940 Asymptomatic postprocedural ovarian failure: Secondary | ICD-10-CM | POA: Diagnosis not present

## 2023-04-01 DIAGNOSIS — E618 Deficiency of other specified nutrient elements: Secondary | ICD-10-CM | POA: Diagnosis not present

## 2023-04-01 DIAGNOSIS — E669 Obesity, unspecified: Secondary | ICD-10-CM | POA: Diagnosis not present

## 2023-04-01 DIAGNOSIS — E611 Iron deficiency: Secondary | ICD-10-CM | POA: Diagnosis not present

## 2023-04-01 DIAGNOSIS — C50919 Malignant neoplasm of unspecified site of unspecified female breast: Secondary | ICD-10-CM | POA: Diagnosis not present

## 2023-04-22 DIAGNOSIS — B353 Tinea pedis: Secondary | ICD-10-CM | POA: Diagnosis not present

## 2023-04-22 DIAGNOSIS — L309 Dermatitis, unspecified: Secondary | ICD-10-CM | POA: Diagnosis not present

## 2023-04-22 DIAGNOSIS — R32 Unspecified urinary incontinence: Secondary | ICD-10-CM | POA: Diagnosis not present

## 2023-04-27 DIAGNOSIS — C50912 Malignant neoplasm of unspecified site of left female breast: Secondary | ICD-10-CM | POA: Diagnosis not present

## 2023-04-27 DIAGNOSIS — Z9013 Acquired absence of bilateral breasts and nipples: Secondary | ICD-10-CM | POA: Diagnosis not present

## 2023-04-27 DIAGNOSIS — K760 Fatty (change of) liver, not elsewhere classified: Secondary | ICD-10-CM | POA: Diagnosis not present

## 2023-04-27 DIAGNOSIS — Q8589 Other phakomatoses, not elsewhere classified: Secondary | ICD-10-CM | POA: Diagnosis not present

## 2023-05-12 ENCOUNTER — Ambulatory Visit: Payer: BC Managed Care – PPO | Admitting: Family Medicine

## 2023-05-12 ENCOUNTER — Encounter: Payer: Self-pay | Admitting: Family Medicine

## 2023-05-12 VITALS — BP 118/86 | HR 99 | Resp 18 | Ht 65.0 in | Wt 217.8 lb

## 2023-05-12 DIAGNOSIS — Z114 Encounter for screening for human immunodeficiency virus [HIV]: Secondary | ICD-10-CM

## 2023-05-12 DIAGNOSIS — Z0000 Encounter for general adult medical examination without abnormal findings: Secondary | ICD-10-CM

## 2023-05-12 DIAGNOSIS — Z124 Encounter for screening for malignant neoplasm of cervix: Secondary | ICD-10-CM

## 2023-05-12 DIAGNOSIS — R0683 Snoring: Secondary | ICD-10-CM

## 2023-05-12 DIAGNOSIS — Z1159 Encounter for screening for other viral diseases: Secondary | ICD-10-CM

## 2023-05-12 NOTE — Assessment & Plan Note (Signed)
-   very pleasant 37 yo female presents for wellness. Form filled out for patient for work.  - will get blood work  - sent referral to gyn pt prefers to do pap smear through them

## 2023-05-12 NOTE — Assessment & Plan Note (Addendum)
-   pt has hx of snoring as well as daytime fatigue. Would take nap in the afternoon. Does get sleepy when driving. - referral sent to sleep medicine for analysis of sleep apnea vs narcolepsy- pt has strong fam history in two brothers with narcolepsy

## 2023-05-12 NOTE — Progress Notes (Signed)
Established patient visit   Patient: Kim Simpson   DOB: August 19, 1986   37 y.o. Female  MRN: [id number] Visit Date: 05/12/2023  Today's healthcare provider: Charlton Amor, DO   Chief Complaint  Patient presents with   Employment Physical    Pt is coming in today to have her work assessment form filled out also would like a sleep study referral, and obgyn    SUBJECTIVE    Chief Complaint  Patient presents with   Employment Physical    Pt is coming in today to have her work assessment form filled out also would like a sleep study referral, and obgyn   HPI Diet: starting to improve has had dental work done and has had to eat certain foods Exercise: starting to increase due to ankle fracture 2 years ago  Screenings: Up to date on mammo and colon   Family hx: HTN: none DM: none Cancer: skin cancer and mom has breast cancer  Pt has concerns today of snoring and daytime fatigue. She does have a hx of narcolepsy in her family-two brothers. Has been noticing that   Review of Systems  Constitutional:  Negative for activity change, fatigue and fever.  Respiratory:  Negative for cough and shortness of breath.   Cardiovascular:  Negative for chest pain.  Gastrointestinal:  Negative for abdominal pain.  Genitourinary:  Negative for difficulty urinating.       Current Meds  Medication Sig   acetaminophen (TYLENOL) 500 MG tablet Take 1,000 mg by mouth every 6 (six) hours as needed.   Ascorbic Acid (VITAMIN C) 1000 MG tablet Take 2,000 mg by mouth daily.   ATHLETES FOOT AF 1 % cream Apply 1 Application topically 2 (two) times daily. As needed   Clobetasol Prop Emollient Base (CLOBETASOL PROPIONATE E) 0.05 % emollient cream Apply 2 Applications topically 2 (two) times daily.   Clobetasol Propionate 0.05 % lotion Apply topically 2 (two) times daily.   imiquimod (ALDARA) 5 % cream Apply topically at bedtime.   meloxicam (MOBIC) 15 MG tablet Take 1 tablet by mouth daily.    TESTOSTERONE UNDECANOATE PO Take 2.5 mg by mouth daily at 6 (six) AM.   VITAMIN D3 50 MCG (2000 UT) TABS Take 1 tablet by mouth daily.   Zinc 50 MG TABS Take 1 tablet by mouth daily.    OBJECTIVE    BP 118/86 (BP Location: Left Arm, Patient Position: Sitting, Cuff Size: Large)   Pulse 99   Resp 18   Ht 5\' 5"  (1.651 m)   Wt 217 lb 12 oz (98.8 kg)   LMP 09/17/2014   SpO2 99%   BMI 36.24 kg/m   Physical Exam Vitals and nursing note reviewed.  Constitutional:      General: She is not in acute distress.    Appearance: Normal appearance.  HENT:     Head: Normocephalic and atraumatic.     Right Ear: External ear normal.     Left Ear: External ear normal.     Nose: Nose normal.  Eyes:     Conjunctiva/sclera: Conjunctivae normal.  Cardiovascular:     Rate and Rhythm: Normal rate and regular rhythm.  Pulmonary:     Effort: Pulmonary effort is normal.     Breath sounds: Normal breath sounds.  Abdominal:     General: Abdomen is flat. Bowel sounds are normal.     Palpations: Abdomen is soft.  Neurological:     General: No focal deficit present.  Mental Status: She is alert and oriented to person, place, and time.  Psychiatric:        Mood and Affect: Mood normal.        Behavior: Behavior normal.        Thought Content: Thought content normal.        Judgment: Judgment normal.        ASSESSMENT & PLAN    Problem List Items Addressed This Visit       Other   Routine adult health maintenance - Primary    - very pleasant 37 yo female presents for wellness. Form filled out for patient for work.  - will get blood work  - sent referral to gyn pt prefers to do pap smear through them        Relevant Orders   CBC   Basic Metabolic Panel (BMET)   Lipid panel   Amb ref to Medical Nutrition Therapy-MNT   Snoring    - pt has hx of snoring as well as daytime fatigue. Would take nap in the afternoon. Does get sleepy when driving. - referral sent to sleep medicine for  analysis of sleep apnea vs narcolepsy- pt has strong fam history in two brothers with narcolepsy      Relevant Orders   Ambulatory referral to Sleep Studies   Other Visit Diagnoses     Screening for HIV (human immunodeficiency virus)       Relevant Orders   HIV antibody (with reflex)   Encounter for hepatitis C screening test for low risk patient       Relevant Orders   Hepatitis C antibody   Screening for cervical cancer       Relevant Orders   Ambulatory referral to Obstetrics / Gynecology            No orders of the defined types were placed in this encounter.   Orders Placed This Encounter  Procedures   CBC   Basic Metabolic Panel (BMET)    Order Specific Question:   Has the patient fasted?    Answer:   No   Lipid panel    Order Specific Question:   Has the patient fasted?    Answer:   No    Order Specific Question:   Release to patient    Answer:   Immediate   HIV antibody (with reflex)   Hepatitis C antibody   Ambulatory referral to Sleep Studies    Referral Priority:   Routine    Referral Type:   Consultation    Referral Reason:   Specialty Services Required    Number of Visits Requested:   1   Ambulatory referral to Obstetrics / Gynecology    Referral Priority:   Routine    Referral Type:   Consultation    Referral Reason:   Specialty Services Required    Requested Specialty:   Obstetrics and Gynecology   Amb ref to Medical Nutrition Therapy-MNT    Referral Priority:   Routine    Referral Type:   Consultation    Referral Reason:   Specialty Services Required    Requested Specialty:   Nutrition    Number of Visits Requested:   1     Charlton Amor, DO  [facility] at [facility] [phone] (phone) [phone] (fax)  [facility]

## 2023-05-13 DIAGNOSIS — K760 Fatty (change of) liver, not elsewhere classified: Secondary | ICD-10-CM | POA: Diagnosis not present

## 2023-05-25 DIAGNOSIS — Z713 Dietary counseling and surveillance: Secondary | ICD-10-CM | POA: Diagnosis not present

## 2023-05-25 DIAGNOSIS — Z7182 Exercise counseling: Secondary | ICD-10-CM | POA: Diagnosis not present

## 2023-05-25 DIAGNOSIS — Z6836 Body mass index (BMI) 36.0-36.9, adult: Secondary | ICD-10-CM | POA: Diagnosis not present

## 2023-06-06 ENCOUNTER — Ambulatory Visit (INDEPENDENT_AMBULATORY_CARE_PROVIDER_SITE_OTHER): Payer: BC Managed Care – PPO | Admitting: Obstetrics and Gynecology

## 2023-06-06 ENCOUNTER — Encounter: Payer: Self-pay | Admitting: Obstetrics and Gynecology

## 2023-06-06 ENCOUNTER — Other Ambulatory Visit (HOSPITAL_COMMUNITY)
Admission: RE | Admit: 2023-06-06 | Discharge: 2023-06-06 | Disposition: A | Discharge status: Home / Self Care | Payer: BC Managed Care – PPO | Source: Ambulatory Visit | Attending: Obstetrics and Gynecology | Admitting: Obstetrics and Gynecology

## 2023-06-06 VITALS — BP 139/92 | HR 112 | Wt 218.0 lb

## 2023-06-06 DIAGNOSIS — E8940 Asymptomatic postprocedural ovarian failure: Secondary | ICD-10-CM | POA: Diagnosis not present

## 2023-06-06 DIAGNOSIS — Z124 Encounter for screening for malignant neoplasm of cervix: Secondary | ICD-10-CM

## 2023-06-06 DIAGNOSIS — M816 Localized osteoporosis [Lequesne]: Secondary | ICD-10-CM | POA: Diagnosis not present

## 2023-06-06 DIAGNOSIS — Z01419 Encounter for gynecological examination (general) (routine) without abnormal findings: Secondary | ICD-10-CM | POA: Insufficient documentation

## 2023-06-06 DIAGNOSIS — L309 Dermatitis, unspecified: Secondary | ICD-10-CM

## 2023-06-06 DIAGNOSIS — Q8589 Other phakomatoses, not elsewhere classified: Secondary | ICD-10-CM

## 2023-06-06 DIAGNOSIS — C50812 Malignant 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: Secondary | ICD-10-CM

## 2023-06-06 NOTE — Progress Notes (Unsigned)
GYNECOLOGY ANNUAL PREVENTATIVE CARE ENCOUNTER NOTE  Subjective:   Kim Simpson is a 37 y.o. G0P0000 female here for a annual gynecologic exam. Current complaints: needs pap, was told she needs pap every 6 months.  Also has ongoing vulvar irritation that is improved now with the cream Rx by derm, wondering if she should see [place] for that or cont to see derm  Does have occasional left sided pelvic pain, worse with sitting, improves with hydration. Gets better when she is less constipated and having regular bowel movements.   Denies abnormal vaginal bleeding, discharge, problems with intercourse or other gynecologic concerns. Declines STI screen.  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 Myna Hidalgo, oncologist with [facility] and is considering Gyn Onc at [facility] but needed to start regular paps again as she hasn't had one in at least 2 years and was told she would need them every 6 months.  *She is also seeing team at [facility] for [place].  *Sees Dr. Tamera Punt for primary care  *Osteopenia diagnosis with Dr. Karie Schwalbe (she was seeing due to broken foot). *Sees a dermatologist for vulvar irritation where she was diagnosed with lichen sclerosis (per patient) and uses an "emollient cream" but cannot remember the specific name of the cream. Has not had biopsy to confirm.   Gynecologic History Patient's last menstrual period was 09/17/2014. Contraception:  s/p BSO Last Pap: 2022, per patient, normal Last mammogram: s/op mastectomy   Obstetric History OB History  Gravida Para Term Preterm AB Living  0 0 0 0 0 0  SAB IAB Ectopic Multiple Live  Births  0 0 0 0 0    Past Medical History:  Diagnosis Date   Cancer (HCC)    Colon polyps    history of breast cancer    Peutz-Jeghers syndrome (HCC)     Past Surgical History:  Procedure Laterality Date   LAPAROSCOPIC OOPHERECTOMY     ex lap with adhesiolysis, bilateral salpingectomy oophorectomy   MASTECTOMY      Current Outpatient Medications on File Prior to Visit  Medication Sig Dispense Refill   acetaminophen (TYLENOL) 500 MG tablet Take 1,000 mg by mouth every 6 (six) hours as needed.     Ascorbic Acid (VITAMIN C) 1000 MG tablet Take 2,000 mg by mouth daily.     ATHLETES FOOT AF 1 % cream Apply 1 Application topically 2 (two) times daily. As needed     Clobetasol Prop Emollient Base (CLOBETASOL PROPIONATE E) 0.05 % emollient cream Apply 2 Applications topically 2 (two) times daily. 30 g 0   Clobetasol Propionate 0.05 % lotion Apply topically 2 (two) times daily.     imiquimod (ALDARA) 5 % cream Apply topically at bedtime.     meloxicam (MOBIC) 15 MG tablet Take 1 tablet by mouth daily.     TESTOSTERONE UNDECANOATE PO Take 2.5 mg by mouth daily at 6 (six) AM.     topiramate (TOPAMAX) 25 MG tablet Take 25 mg by mouth daily.     VALIUM 10 MG tablet Take 10 mg by mouth 2 (two) times daily.     VITAMIN D3 50 MCG (2000 UT) TABS Take 1 tablet by mouth  daily.     Zinc 50 MG TABS Take 1 tablet by mouth daily.     No current facility-administered medications on file prior to visit.    Allergies  Allergen Reactions   Aspirin Other (See Comments)    Encephalitis as a toddler   Cephalexin Hives and Itching   Chlorine Itching, Dermatitis and Rash   Sulfamethoxazole-Trimethoprim Itching and Rash    Reports rash on tops of feet; itching/burning      Social History   Socioeconomic History   Marital status: Single    Spouse name: Not on file   Number of children: Not on file   Years of education: Not on file   Highest education level: Not on file  Occupational History    Not on file  Tobacco Use   Smoking status: Never   Smokeless tobacco: Not on file  Vaping Use   Vaping status: Never Used  Substance and Sexual Activity   Alcohol use: No   Drug use: No   Sexual activity: Never  Other Topics Concern   Not on file  Social History Narrative   Not on file   Social Determinants of Health   Financial Resource Strain: Not on file  Food Insecurity: No Food Insecurity (06/14/2022)   Hunger Vital Sign    Worried About Running Out of Food in the Last Year: Never true    Ran Out of Food in the Last Year: Never true  Transportation Needs: Not on file  Physical Activity: Not on file  Stress: Not on file  Social Connections: Unknown (02/15/2022)   [facility] from [facility]   Social Network    Social Network: Not on file  Intimate Partner Violence: Not At Risk (06/14/2022)   Humiliation, Afraid, Rape, and Kick questionnaire    Fear of Current or Ex-Partner: No    Emotionally Abused: No    Physically Abused: No    Sexually Abused: No    Family History  Problem Relation Age of Onset   Hyperlipidemia Father    Colon polyps Brother     The following portions of the patient's history were reviewed and updated as appropriate: allergies, current medications, past family history, past medical history, past social history, past surgical history and problem list.  Review of Systems Pertinent items are noted in HPI.   Objective:  BP (!) 139/92   Pulse (!) 112   Wt 98.9 kg   LMP 09/17/2014   BMI 36.28 kg/m  CONSTITUTIONAL: Well-developed, well-nourished female in no acute distress.  HENT:  Normocephalic, atraumatic, External right and left ear normal. Oropharynx is clear and moist EYES: Conjunctivae and EOM are normal. Pupils are equal, round, and reactive to light. No scleral icterus.  NECK: Normal range of motion, supple, no masses.  Normal thyroid.  SKIN: Skin is warm and dry. No rash noted. Not diaphoretic. No erythema. No  pallor. NEUROLOGIC: Alert and oriented to person, place, and time. Normal reflexes, muscle tone coordination. No cranial nerve deficit noted. PSYCHIATRIC: Normal mood and affect. Normal behavior. Normal judgment and thought content. CARDIOVASCULAR: Normal heart rate noted RESPIRATORY: Effort normal, no problems with respiration noted. BREASTS: s/p mastectomy, surgical scars visible and silicone implants palpable, no obvious masses ABDOMEN: Soft, no distention noted.  No tenderness, rebound or guarding.  PELVIC: Normal appearing mildly atrophic external genitalia; some mild erythema on labia majora at friction points but no white patches, scaliness or other vulvar issues noted. Normal appearing vaginal mucosa and cervix.  No abnormal discharge noted.  Pap smear obtained. Normal uterine size, no other palpable masses, no uterine or adnexal tenderness. MUSCULOSKELETAL: Normal range of motion. No tenderness.  No cyanosis, clubbing, or edema.   Exam done with chaperone present.  Assessment and Plan:   1. Well woman exam - Cytology - PAP( )  2. Peutz-Jeghers syndrome (HCC) Follows with oncology  3. Localized osteoporosis, unspecified pathological fracture presence  4. Surgical menopause - S/p BSO - Patient reports she is HRT prescribed by [facility] -HRT generally contraindicated in patients with history of breast cancer due to risk of recurrence of breast cancer - advised patient I am not familiar with recommendations that it may be okay after a certain number of years but will consult colleagues and follow up with her  5. 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 Vulvar dermatitis - Patient reports she has been told she has lichen sclerosis but does not have a biopsy proven diagnosis. States she feels much  better with regular use of th steroid cream. Reviewed that regular use of steroid cream can thin and ultimately make symptoms worse, should minimize use of cream as much as possible - recommend she cont with dermatology but will need biopsy at some point - biopsy if symptoms recur  7. Cervical cancer screening Pap completed today  F/u in 4-6 weeks for follow up discussion  Will follow up results of pap smear and manage accordingly. Encouraged improvement in diet and exercise.  Declines STI screen.   Routine preventative health maintenance measures emphasized. Please refer to After Visit Summary for other counseling recommendations.    Baldemar Lenis, MD, [facility] Attending Center for [employer] [facility])

## 2023-06-06 NOTE — Progress Notes (Unsigned)
Patient presents for Annual.  LMP: Not having periods due to Chemo/ removal of ovaries  Last pap: Date: 2022 with [facility]  Contraception: Not sexually active Mammogram: Not yet indicated STD Screening: not indicated Flu Vaccine : N/A  CC:  Annual  Pt is not sexually active   Requesting a very small speculum  Has been seeing derm for possible lichen sclerosa

## 2023-06-08 LAB — CYTOLOGY - PAP
Comment: NEGATIVE
Diagnosis: NEGATIVE
High risk HPV: NEGATIVE

## 2023-06-09 ENCOUNTER — Encounter: Payer: Self-pay | Admitting: Obstetrics and Gynecology

## 2023-06-14 DIAGNOSIS — K760 Fatty (change of) liver, not elsewhere classified: Secondary | ICD-10-CM | POA: Diagnosis not present

## 2023-06-14 DIAGNOSIS — R194 Change in bowel habit: Secondary | ICD-10-CM | POA: Diagnosis not present

## 2023-06-14 DIAGNOSIS — Q8589 Other phakomatoses, not elsewhere classified: Secondary | ICD-10-CM | POA: Diagnosis not present

## 2023-06-14 DIAGNOSIS — R197 Diarrhea, unspecified: Secondary | ICD-10-CM | POA: Diagnosis not present

## 2023-06-27 DIAGNOSIS — Q8589 Other phakomatoses, not elsewhere classified: Secondary | ICD-10-CM | POA: Diagnosis not present

## 2023-06-27 DIAGNOSIS — R1013 Epigastric pain: Secondary | ICD-10-CM | POA: Diagnosis not present

## 2023-06-30 ENCOUNTER — Encounter: Payer: Self-pay | Admitting: Obstetrics and Gynecology

## 2023-07-07 DIAGNOSIS — M5441 Lumbago with sciatica, right side: Secondary | ICD-10-CM | POA: Diagnosis not present

## 2023-07-07 DIAGNOSIS — M542 Cervicalgia: Secondary | ICD-10-CM | POA: Diagnosis not present

## 2023-07-07 DIAGNOSIS — M546 Pain in thoracic spine: Secondary | ICD-10-CM | POA: Diagnosis not present

## 2023-07-07 DIAGNOSIS — M25561 Pain in right knee: Secondary | ICD-10-CM | POA: Diagnosis not present

## 2023-07-11 ENCOUNTER — Encounter: Payer: Self-pay | Admitting: Obstetrics and Gynecology

## 2023-07-11 ENCOUNTER — Other Ambulatory Visit (HOSPITAL_COMMUNITY)
Admission: RE | Admit: 2023-07-11 | Discharge: 2023-07-11 | Disposition: A | Discharge status: Home / Self Care | Payer: BC Managed Care – PPO | Source: Ambulatory Visit | Attending: Obstetrics and Gynecology | Admitting: Obstetrics and Gynecology

## 2023-07-11 ENCOUNTER — Ambulatory Visit: Payer: BC Managed Care – PPO | Admitting: Obstetrics and Gynecology

## 2023-07-11 VITALS — BP 130/91 | HR 95 | Resp 16 | Ht 65.0 in | Wt 221.0 lb

## 2023-07-11 DIAGNOSIS — L988 Other specified disorders of the skin and subcutaneous tissue: Secondary | ICD-10-CM

## 2023-07-11 DIAGNOSIS — N904 Leukoplakia of vulva: Secondary | ICD-10-CM | POA: Diagnosis not present

## 2023-07-11 DIAGNOSIS — Q8589 Other phakomatoses, not elsewhere classified: Secondary | ICD-10-CM

## 2023-07-11 DIAGNOSIS — N958 Other specified menopausal and perimenopausal disorders: Secondary | ICD-10-CM

## 2023-07-11 DIAGNOSIS — L309 Dermatitis, unspecified: Secondary | ICD-10-CM | POA: Insufficient documentation

## 2023-07-11 DIAGNOSIS — Z124 Encounter for screening for malignant neoplasm of cervix: Secondary | ICD-10-CM

## 2023-07-11 NOTE — Progress Notes (Signed)
GYNECOLOGY OFFICE VISIT NOTE  History:   Kim Simpson is a 37 y.o. G0P0000 here today for follow up regarding her lichen sclerosis.   Discussed the use of AI scribe software for clinical note transcription with the patient, who gave verbal consent to proceed.  History of Present Illness   The patient, with a history of cancer, chemotherapy, radiation, and menopause, presents with concerns about a skin condition previously diagnosed as lichen sclerosis. She has been using a steroid, clobetasol, as treatment but has recently stopped (Early august) to observe the skin's reaction. The patient reports that her skin is drier since undergoing menopause and chemotherapy. She also mentions changes in her body due to chemotherapy, including the cessation of menstruation before undergoing ovarian surgery. The patient is open to a biopsy if it would aid in treatment decisions. She expresses a desire to prevent another occurrence of cancer and is relieved that her recent Pap smear results were normal.     She also notes vaginal dryness and wondering what she can do to help with that.  Her mom passed away last year from cancer so she doesn't have someone who she can talk to about these things.      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   Health Maintenance:   Normal pap and negative HRHPV: Diagnosis  Date Value Ref Range Status  06/06/2023   Final   - Negative for intraepithelial lesion or malignancy (NILM)    Review of Systems:  Pertinent items noted in HPI and remainder of comprehensive ROS otherwise negative.  Physical Exam:  BP (!) 130/91   Pulse 95   Resp 16   Ht 5\' 5"  (1.651 m)   Wt 221 lb (100.2 kg)   LMP 09/17/2014   BMI 36.78 kg/m  CONSTITUTIONAL: Well-developed, well-nourished female in no acute distress.  HEENT:  Normocephalic, atraumatic. External right and left ear normal. No scleral icterus.  NECK: Normal range of motion, supple, no masses noted on observation SKIN: No rash noted. Not diaphoretic. No erythema. No pallor. MUSCULOSKELETAL: Normal range of motion. No edema noted. NEUROLOGIC: Alert and oriented to person, place, and time. Normal muscle tone coordination. No cranial nerve deficit noted. PSYCHIATRIC: Normal mood and affect. Normal behavior. Normal judgment and thought content.  PELVIC: Some mild loss of architecture, namely the labia minora. Still normal clitoral anatomy. Labia majora normal. No evidence of skin changes on perineum. Some faint white skin changes on left labia minora potentially consistent with lichen sclerosus.   Procedure: Vulvar Biopsy Description: The area at the junction of the labia minora was sterilized and anesthetized with 0.5 cc of 1% lidocaine and hurricane spray. A 4mm punch biopsy was performed after confirming the skin was numb, obtaining a small tissue sample. Silver nitrate was applied to achieve hemostasis. Neosporin applied after.  Informed Consent: Counseling included the procedure steps, anesthesia with a pinch and burn sensation, potential minor bleeding, and post-procedure care with Neosporin. Risks, benefits, and alternatives were discussed.  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 Patient has been using Clobetasol with improvement but stopped in early August to assess skin. Noted irritation and itching since discontinuation. -Performed vulvar biopsy today to  confirm diagnosis. -Request old dermatology records for further information. -Consider referral to a recommended dermatologist within [facility] system for further management.  Post-Menopausal Changes History of chemotherapy and radiation leading to changes in skin and vaginal health. -Continue monitoring and managing symptoms as needed. - Discussed replens and coconut suppositories (she found these are available online). If refractory to these measures, she could use vaginal estrogen. We reviewed vaginal estrogen is considered acceptable with history of breast cancer which is different from oral/patch HRT. However, I still would not use it as a first line therapy.   Cancer Surveillance History of cancer, currently in remission. Recent Pap smear was negative. -Continue regular screenings and follow-ups as per guidelines. 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 If she wishes to do more often (she saw a GYN/Onc who recommended Q70months, she may elect to alternate visits with them and [place]).       No orders of the defined types were placed in this encounter.    Routine preventative health maintenance measures emphasized. Please refer to After Visit Summary for other counseling recommendations.   Return in about 1 year (around 07/10/2024), or if symptoms worsen or fail to improve, for annual.  Milas Hock, MD, FACOG Obstetrician & Gynecologist, [facility] for [employer], [facility]

## 2023-07-13 ENCOUNTER — Encounter: Payer: Self-pay | Admitting: Obstetrics and Gynecology

## 2023-07-13 DIAGNOSIS — M5441 Lumbago with sciatica, right side: Secondary | ICD-10-CM | POA: Diagnosis not present

## 2023-07-13 DIAGNOSIS — M25561 Pain in right knee: Secondary | ICD-10-CM | POA: Diagnosis not present

## 2023-07-13 DIAGNOSIS — M546 Pain in thoracic spine: Secondary | ICD-10-CM | POA: Diagnosis not present

## 2023-07-13 DIAGNOSIS — M542 Cervicalgia: Secondary | ICD-10-CM | POA: Diagnosis not present

## 2023-07-13 LAB — SURGICAL PATHOLOGY

## 2023-07-18 MED ORDER — TRIAMCINOLONE ACETONIDE 0.1 % EX OINT: 1.0000 | TOPICAL_OINTMENT | Freq: Two times a day (BID) | CUTANEOUS | 1 refills | Status: AC

## 2023-07-18 NOTE — Addendum Note (Signed)
Addended by: Milas Hock A on: 07/18/2023 08:34 AM   Modules accepted: Orders

## 2023-08-03 DIAGNOSIS — M542 Cervicalgia: Secondary | ICD-10-CM | POA: Diagnosis not present

## 2023-08-03 DIAGNOSIS — M5441 Lumbago with sciatica, right side: Secondary | ICD-10-CM | POA: Diagnosis not present

## 2023-08-03 DIAGNOSIS — M546 Pain in thoracic spine: Secondary | ICD-10-CM | POA: Diagnosis not present

## 2023-08-03 DIAGNOSIS — M25561 Pain in right knee: Secondary | ICD-10-CM | POA: Diagnosis not present

## 2023-08-11 DIAGNOSIS — R12 Heartburn: Secondary | ICD-10-CM | POA: Diagnosis not present

## 2023-08-11 DIAGNOSIS — R140 Abdominal distension (gaseous): Secondary | ICD-10-CM | POA: Diagnosis not present

## 2023-08-11 DIAGNOSIS — R142 Eructation: Secondary | ICD-10-CM | POA: Diagnosis not present

## 2023-08-11 DIAGNOSIS — Q8589 Other phakomatoses, not elsewhere classified: Secondary | ICD-10-CM | POA: Diagnosis not present

## 2023-08-11 DIAGNOSIS — R110 Nausea: Secondary | ICD-10-CM | POA: Diagnosis not present

## 2023-08-11 DIAGNOSIS — R1013 Epigastric pain: Secondary | ICD-10-CM | POA: Diagnosis not present

## 2023-08-17 DIAGNOSIS — M25561 Pain in right knee: Secondary | ICD-10-CM | POA: Diagnosis not present

## 2023-08-17 DIAGNOSIS — M5441 Lumbago with sciatica, right side: Secondary | ICD-10-CM | POA: Diagnosis not present

## 2023-08-17 DIAGNOSIS — M542 Cervicalgia: Secondary | ICD-10-CM | POA: Diagnosis not present

## 2023-08-17 DIAGNOSIS — M546 Pain in thoracic spine: Secondary | ICD-10-CM | POA: Diagnosis not present

## 2023-08-29 DIAGNOSIS — R109 Unspecified abdominal pain: Secondary | ICD-10-CM | POA: Diagnosis not present

## 2023-08-29 DIAGNOSIS — E039 Hypothyroidism, unspecified: Secondary | ICD-10-CM | POA: Diagnosis not present

## 2023-08-29 DIAGNOSIS — E538 Deficiency of other specified B group vitamins: Secondary | ICD-10-CM | POA: Diagnosis not present

## 2023-08-29 DIAGNOSIS — E049 Nontoxic goiter, unspecified: Secondary | ICD-10-CM | POA: Diagnosis not present

## 2023-08-29 DIAGNOSIS — R000 Tachycardia, unspecified: Secondary | ICD-10-CM | POA: Diagnosis not present

## 2023-08-29 DIAGNOSIS — E559 Vitamin D deficiency, unspecified: Secondary | ICD-10-CM | POA: Diagnosis not present

## 2023-08-29 DIAGNOSIS — R0789 Other chest pain: Secondary | ICD-10-CM | POA: Diagnosis not present

## 2023-08-29 DIAGNOSIS — N946 Dysmenorrhea, unspecified: Secondary | ICD-10-CM | POA: Diagnosis not present

## 2023-08-29 DIAGNOSIS — E782 Mixed hyperlipidemia: Secondary | ICD-10-CM | POA: Diagnosis not present

## 2023-08-31 DIAGNOSIS — M546 Pain in thoracic spine: Secondary | ICD-10-CM | POA: Diagnosis not present

## 2023-08-31 DIAGNOSIS — M542 Cervicalgia: Secondary | ICD-10-CM | POA: Diagnosis not present

## 2023-08-31 DIAGNOSIS — M25561 Pain in right knee: Secondary | ICD-10-CM | POA: Diagnosis not present

## 2023-08-31 DIAGNOSIS — M5441 Lumbago with sciatica, right side: Secondary | ICD-10-CM | POA: Diagnosis not present

## 2023-09-02 DIAGNOSIS — E6609 Other obesity due to excess calories: Secondary | ICD-10-CM | POA: Diagnosis not present

## 2023-09-02 DIAGNOSIS — J020 Streptococcal pharyngitis: Secondary | ICD-10-CM | POA: Diagnosis not present

## 2023-09-02 DIAGNOSIS — R059 Cough, unspecified: Secondary | ICD-10-CM | POA: Diagnosis not present

## 2023-09-02 DIAGNOSIS — Z6837 Body mass index (BMI) 37.0-37.9, adult: Secondary | ICD-10-CM | POA: Diagnosis not present

## 2023-09-02 DIAGNOSIS — R030 Elevated blood-pressure reading, without diagnosis of hypertension: Secondary | ICD-10-CM | POA: Diagnosis not present

## 2023-09-02 DIAGNOSIS — Z20822 Contact with and (suspected) exposure to covid-19: Secondary | ICD-10-CM | POA: Diagnosis not present

## 2023-09-07 DIAGNOSIS — M5441 Lumbago with sciatica, right side: Secondary | ICD-10-CM | POA: Diagnosis not present

## 2023-09-07 DIAGNOSIS — M25561 Pain in right knee: Secondary | ICD-10-CM | POA: Diagnosis not present

## 2023-09-07 DIAGNOSIS — M546 Pain in thoracic spine: Secondary | ICD-10-CM | POA: Diagnosis not present

## 2023-09-07 DIAGNOSIS — M542 Cervicalgia: Secondary | ICD-10-CM | POA: Diagnosis not present

## 2023-09-13 DIAGNOSIS — E8940 Asymptomatic postprocedural ovarian failure: Secondary | ICD-10-CM | POA: Diagnosis not present

## 2023-09-13 DIAGNOSIS — E669 Obesity, unspecified: Secondary | ICD-10-CM | POA: Diagnosis not present

## 2023-09-13 DIAGNOSIS — E559 Vitamin D deficiency, unspecified: Secondary | ICD-10-CM | POA: Diagnosis not present

## 2023-09-13 DIAGNOSIS — R5383 Other fatigue: Secondary | ICD-10-CM | POA: Diagnosis not present

## 2023-09-19 DIAGNOSIS — M542 Cervicalgia: Secondary | ICD-10-CM | POA: Diagnosis not present

## 2023-09-19 DIAGNOSIS — M546 Pain in thoracic spine: Secondary | ICD-10-CM | POA: Diagnosis not present

## 2023-09-19 DIAGNOSIS — M25561 Pain in right knee: Secondary | ICD-10-CM | POA: Diagnosis not present

## 2023-09-19 DIAGNOSIS — M5441 Lumbago with sciatica, right side: Secondary | ICD-10-CM | POA: Diagnosis not present

## 2023-10-05 DIAGNOSIS — M5441 Lumbago with sciatica, right side: Secondary | ICD-10-CM | POA: Diagnosis not present

## 2023-10-05 DIAGNOSIS — M546 Pain in thoracic spine: Secondary | ICD-10-CM | POA: Diagnosis not present

## 2023-10-05 DIAGNOSIS — M25561 Pain in right knee: Secondary | ICD-10-CM | POA: Diagnosis not present

## 2023-10-05 DIAGNOSIS — M542 Cervicalgia: Secondary | ICD-10-CM | POA: Diagnosis not present

## 2023-10-17 DIAGNOSIS — Z79899 Other long term (current) drug therapy: Secondary | ICD-10-CM | POA: Diagnosis not present

## 2023-10-17 DIAGNOSIS — K30 Functional dyspepsia: Secondary | ICD-10-CM | POA: Diagnosis not present

## 2023-10-17 DIAGNOSIS — T7840XS Allergy, unspecified, sequela: Secondary | ICD-10-CM | POA: Diagnosis not present

## 2023-10-17 DIAGNOSIS — J309 Allergic rhinitis, unspecified: Secondary | ICD-10-CM | POA: Diagnosis not present

## 2023-10-17 DIAGNOSIS — J300 Vasomotor rhinitis: Secondary | ICD-10-CM | POA: Diagnosis not present

## 2023-10-17 DIAGNOSIS — Z6836 Body mass index (BMI) 36.0-36.9, adult: Secondary | ICD-10-CM | POA: Diagnosis not present

## 2023-10-17 DIAGNOSIS — Z853 Personal history of malignant neoplasm of breast: Secondary | ICD-10-CM | POA: Diagnosis not present

## 2023-12-07 ENCOUNTER — Telehealth: Payer: Self-pay | Admitting: *Deleted

## 2023-12-07 NOTE — Telephone Encounter (Signed)
 Left patient a message to call and schedule 6 month F/U with Dr. Para March for a pap.

## 2023-12-11 IMAGING — DX DG FOOT COMPLETE 3+V*R*
3 series · 3 of 3 positions shown · non-contrast
Comparison: Right foot radiographs 12/03/2021

CLINICAL DATA: Anterolateral right foot and ankle pain. Fell down
stairs 1 year ago.

EXAM:
RIGHT FOOT COMPLETE - 3+ VIEW; RIGHT ANKLE - COMPLETE 3+ VIEW

[foot ap]
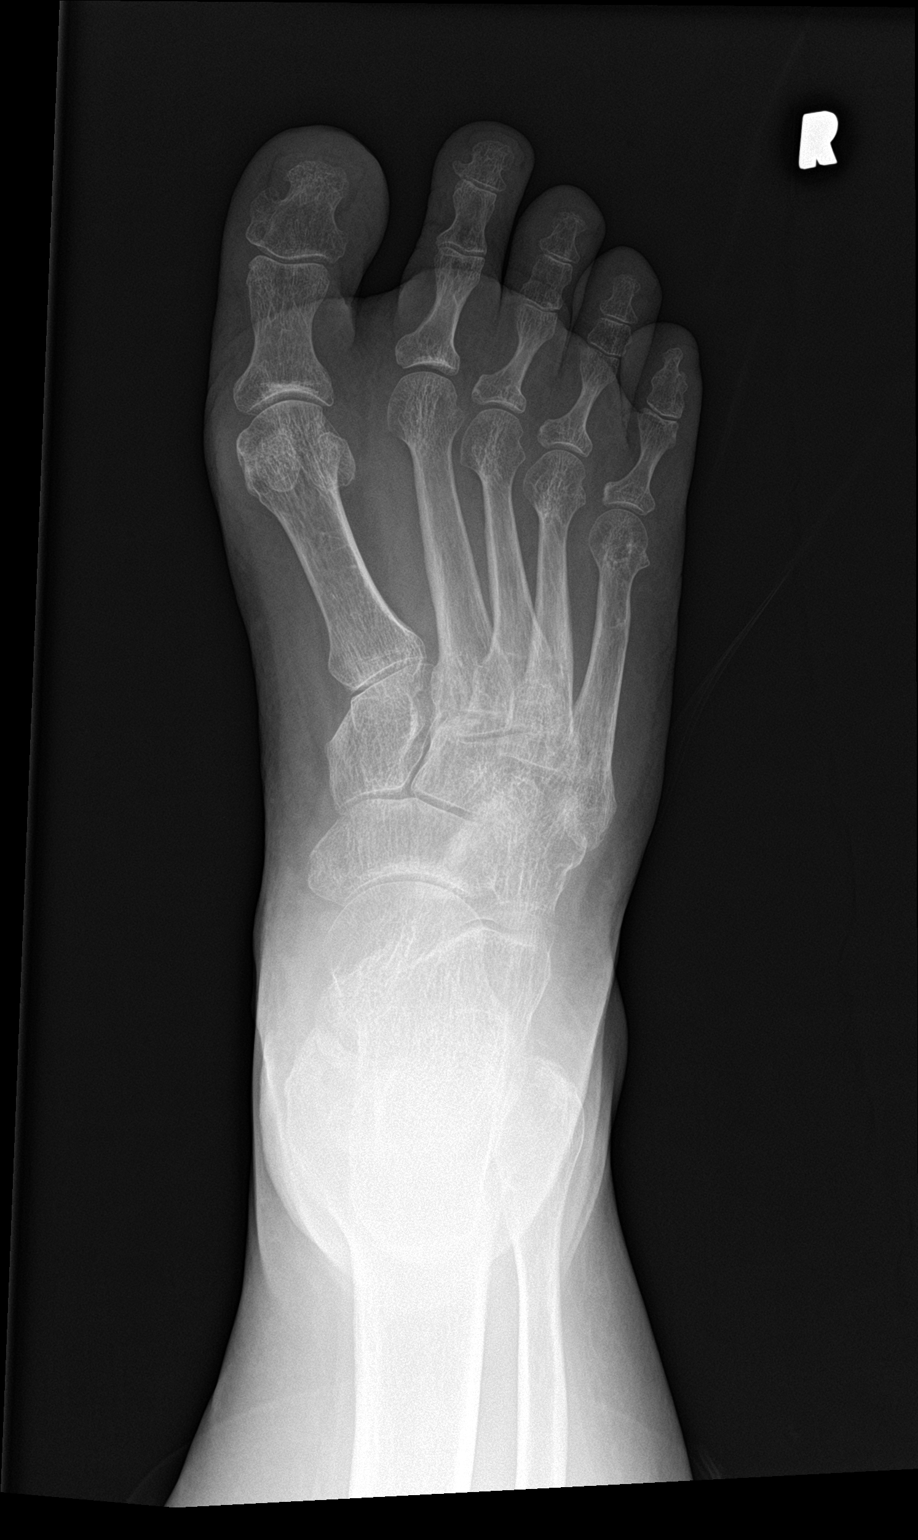

[foot obl]
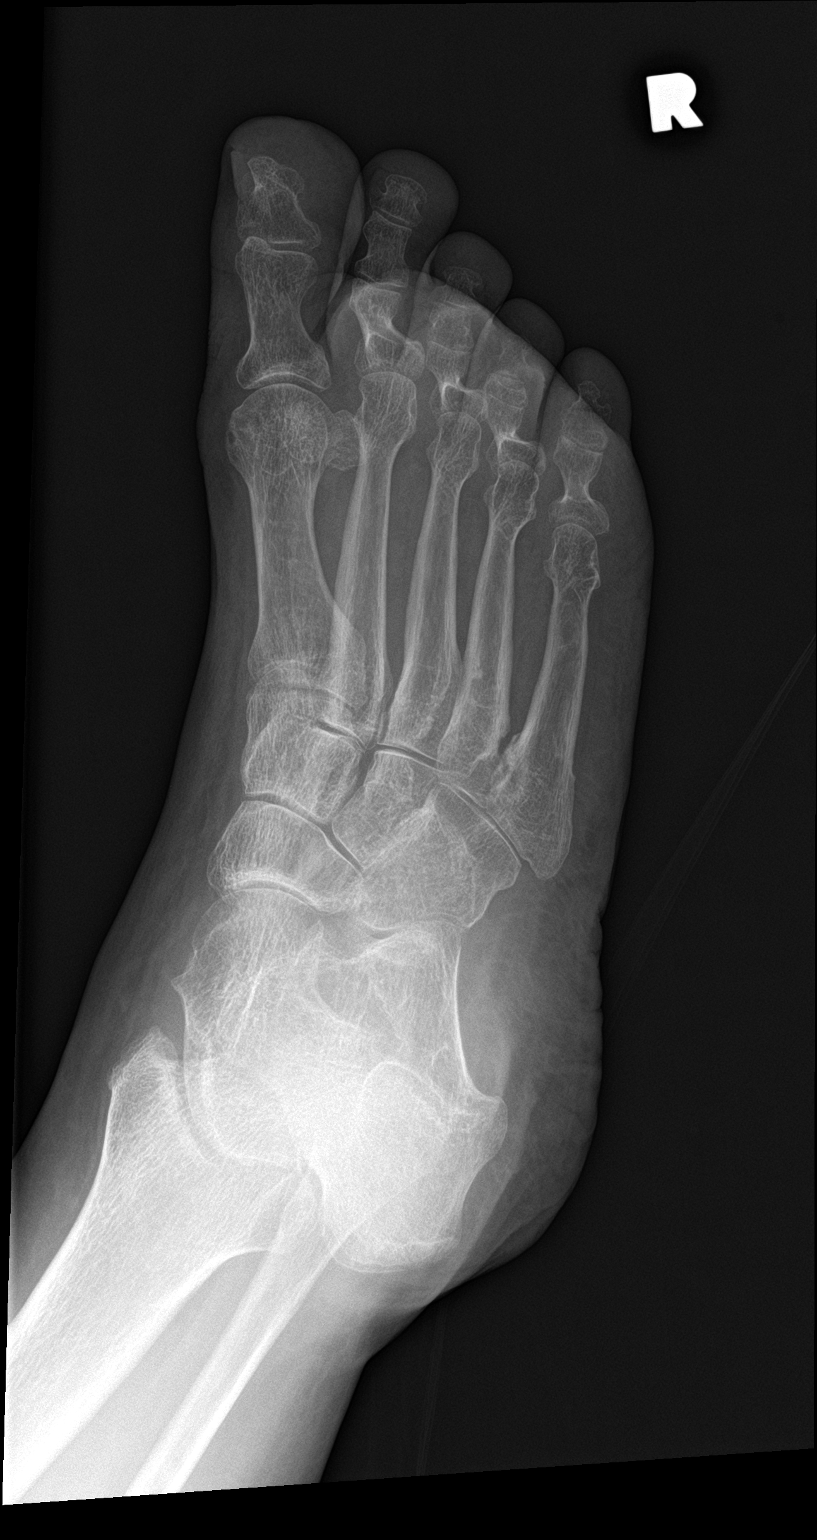

[foot lat]
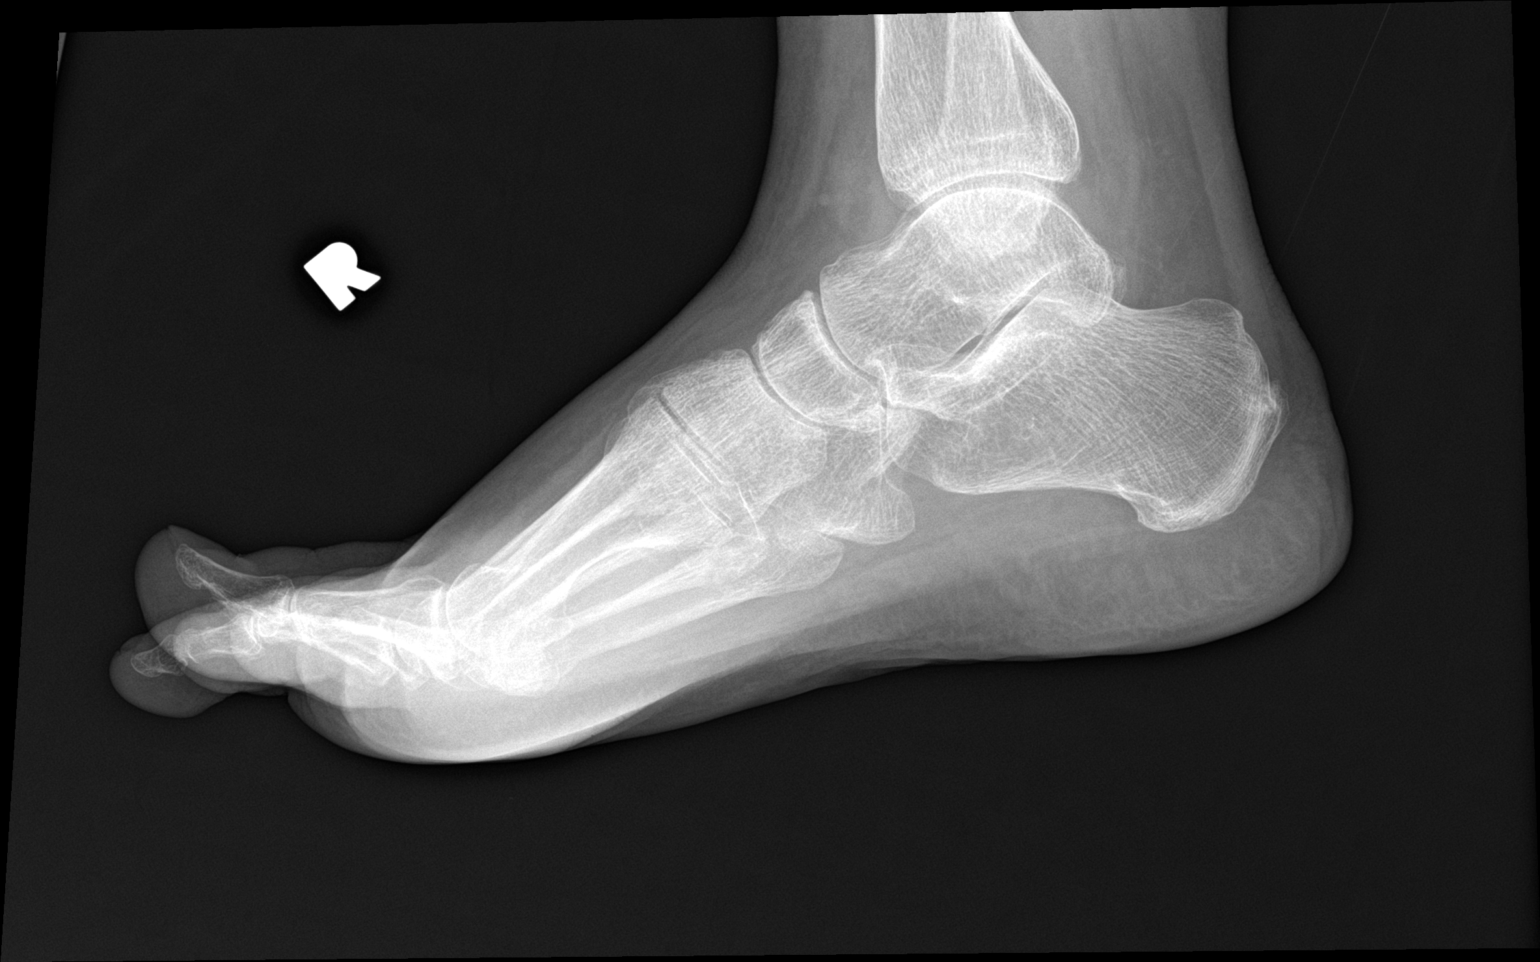

[3 of 3 positions shown; findings below may reference images not displayed]

FINDINGS: Right ankle:

The ankle mortise is symmetric and intact. There is mild lucency
within the distal medial aspect of the medial malleolus and adjacent
talus nonspecific but possibly secondary to degenerative cystic
change. Mild anterior tibiotalar joint space narrowing. No acute
fracture line is seen. No dislocation.

Right foot:

Minimal lateral aspect of the great toe metatarsophalangeal joint
space narrowing. Mild tarsometatarsal joint space narrowing
diffusely. Minimal chronic spurring at the Achilles insertion on the
calcaneus. No acute fracture or dislocation.
IMPRESSION: :
IMPRESSION: 1. Mild tibiotalar osteoarthritis.
2. Minimal great toe metatarsophalangeal joint osteoarthritis.

## 2023-12-11 IMAGING — DX DG ANKLE COMPLETE 3+V*R*
3 series · 3 of 3 positions shown · non-contrast
Comparison: Right foot radiographs 12/03/2021

CLINICAL DATA: Anterolateral right foot and ankle pain. Fell down
stairs 1 year ago.

EXAM:
RIGHT FOOT COMPLETE - 3+ VIEW; RIGHT ANKLE - COMPLETE 3+ VIEW

[ankle ap]
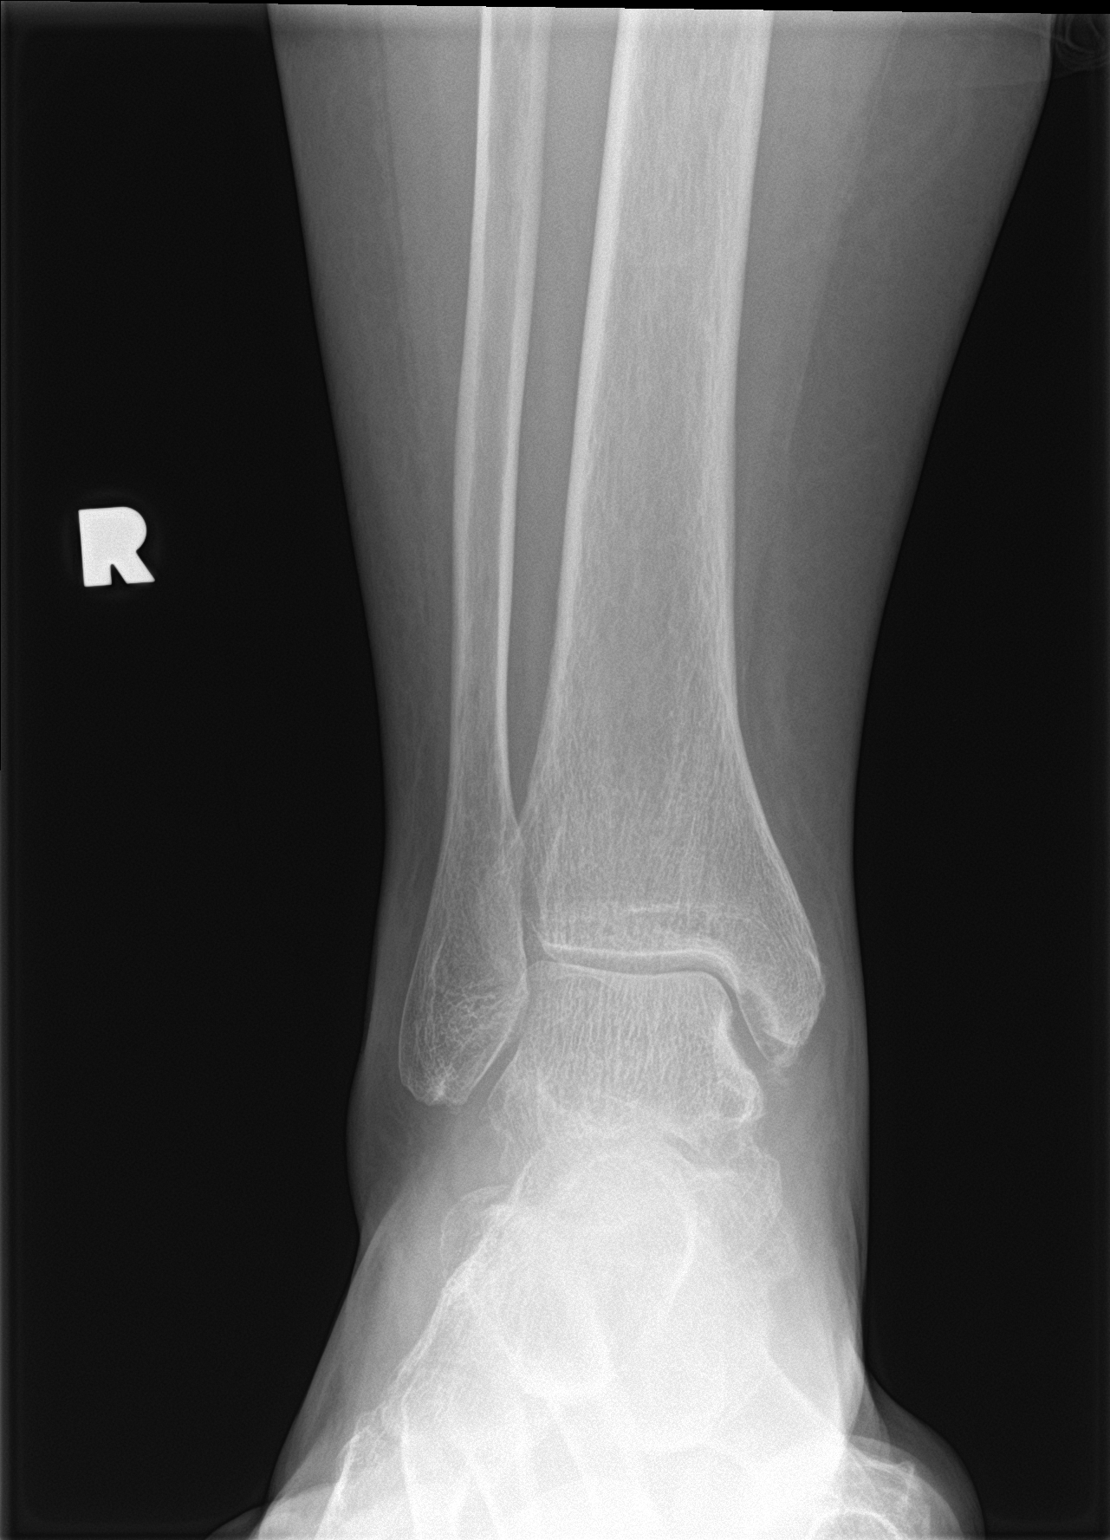

[ankle obl]
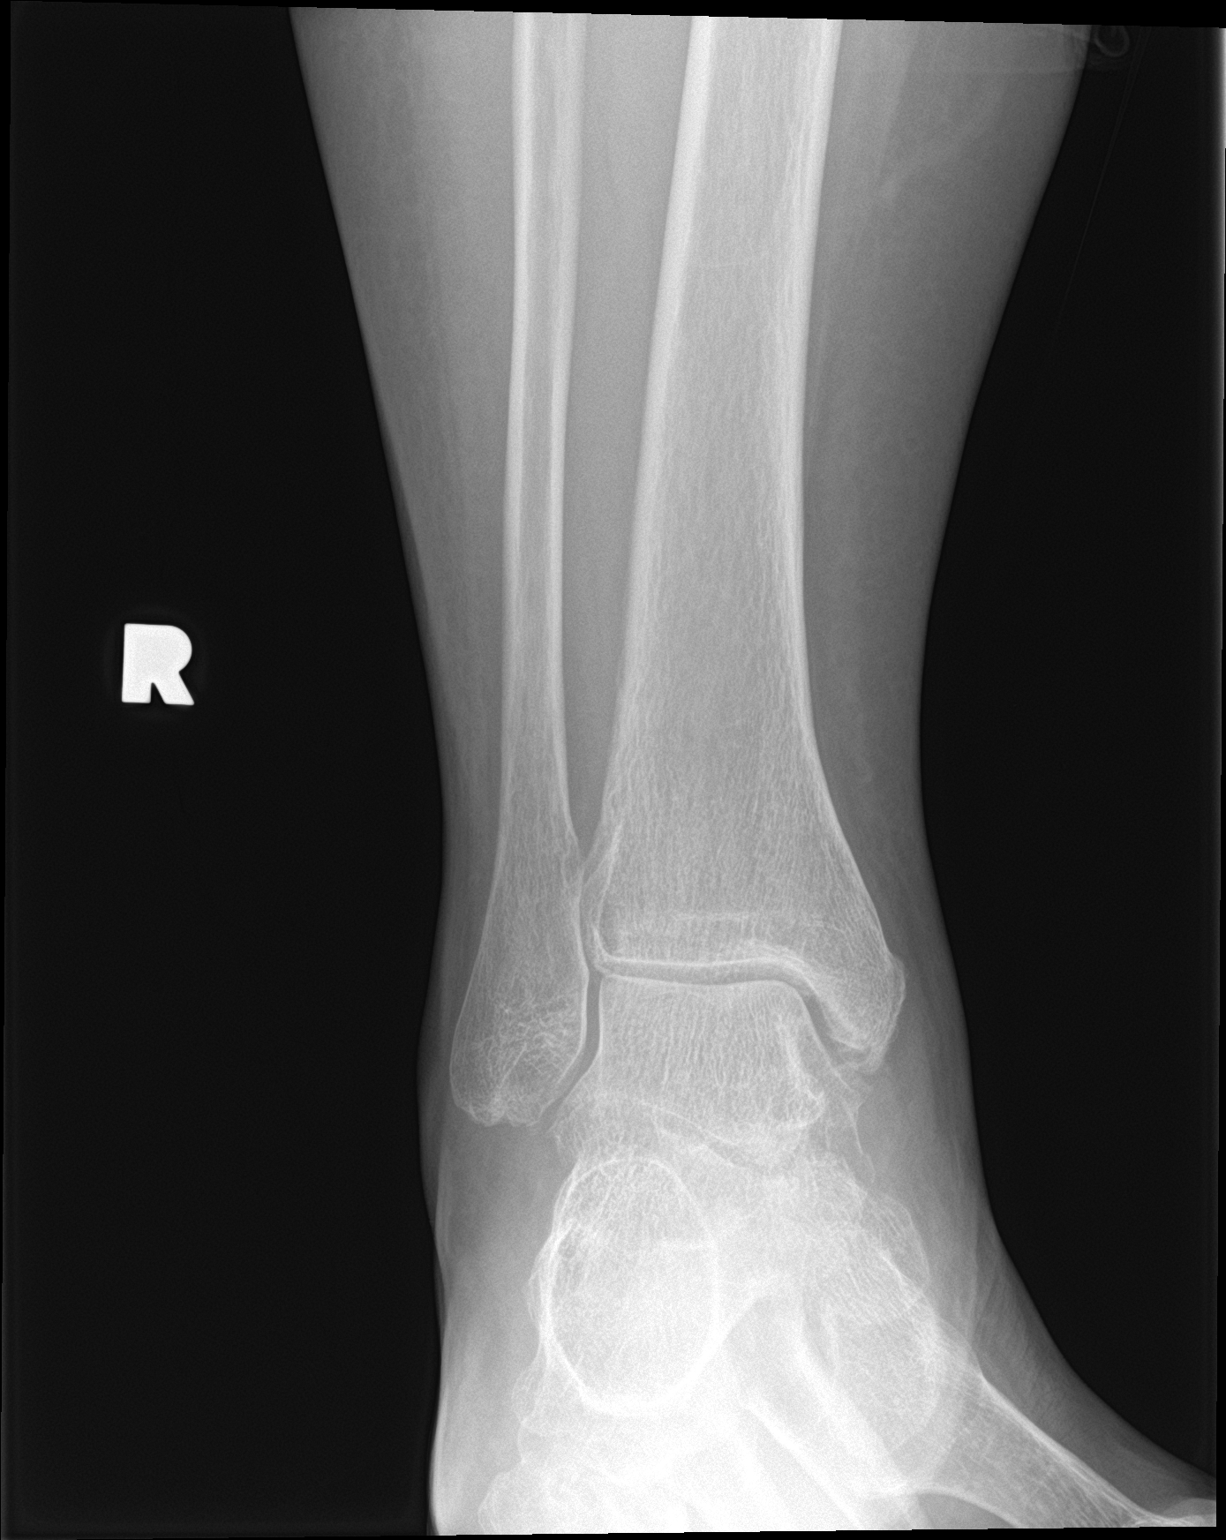

[ankle lat]
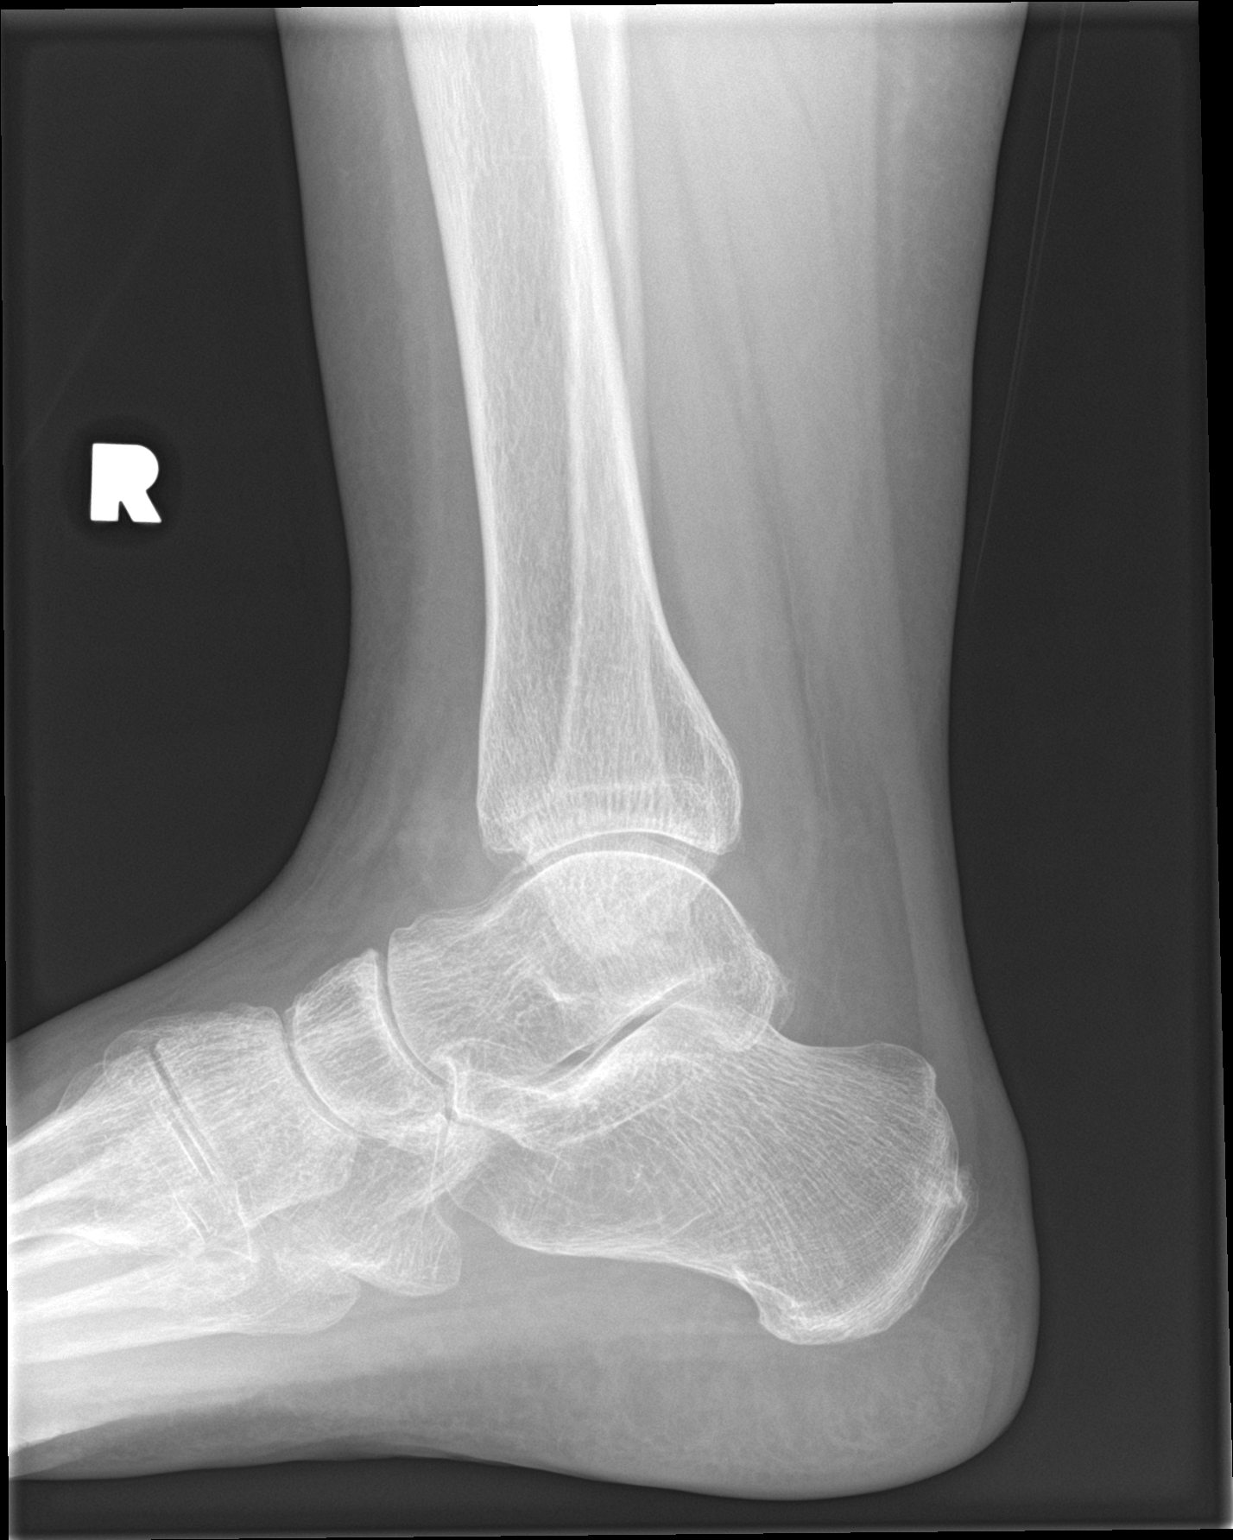

[3 of 3 positions shown; findings below may reference images not displayed]

FINDINGS: Right ankle:

The ankle mortise is symmetric and intact. There is mild lucency
within the distal medial aspect of the medial malleolus and adjacent
talus nonspecific but possibly secondary to degenerative cystic
change. Mild anterior tibiotalar joint space narrowing. No acute
fracture line is seen. No dislocation.

Right foot:

Minimal lateral aspect of the great toe metatarsophalangeal joint
space narrowing. Mild tarsometatarsal joint space narrowing
diffusely. Minimal chronic spurring at the Achilles insertion on the
calcaneus. No acute fracture or dislocation.
IMPRESSION: :
IMPRESSION: 1. Mild tibiotalar osteoarthritis.
2. Minimal great toe metatarsophalangeal joint osteoarthritis.

## 2023-12-16 ENCOUNTER — Ambulatory Visit: Payer: Self-pay | Admitting: Family Medicine

## 2023-12-16 NOTE — Telephone Encounter (Signed)
 Copied from CRM [id number]. Topic: Clinical - Medical Advice >> Dec 16, 2023  8:52 AM Kim Simpson wrote: Reason for CRM: The patient has began to experience skin irritation on their neck starting Wednesday evening  The patient shares that irritation is raised  The patient shares that they have been in semiclose contact with a coworker who has tested ppositive for shingles and also has a history of skin irritation themselves  Please contact the patient further when possible

## 2023-12-16 NOTE — Telephone Encounter (Addendum)
 Pt reports painful, itchy rash to the back of the neck since Wednesday. Hx of shingles and has had contact with a coworker that is also positive for shingles. Pt has an appt scheduled for Monday 3/3. This RN was going to try to book pt before then. Pt agreeable to be seen at a different office. Pt had to get off the phone but states she will call us back for scheduling.   Chief Complaint: rash Symptoms: rash Frequency: since Wednesday Pertinent Negatives: Patient denies fever Disposition: [] ED /[] Urgent Care (no appt availability in office) / [x] Appointment(In office/virtual)/ []  [facility]/ [] Home Care/ [] Refused Recommended Disposition /[] [facility]/ []  Follow-up with PCP Additional Notes: Pt reports rash since Wednesday. Reports 3 to 4 red thumbnail-sized spots to the back of her neck. 5/10 itching and pain. States rash is "warm and feverish." Pt denies having a fever. Endorses body aches and fatigue. Pt has a hx of shingles and states this feels similar. Pt has also had contact with a coworker that is shingles positive. Per protocol pt should be seen within 24 hrs. Pt has an appt  Monday 3/3. Pt agreeable to being seen in a different office to be seen sooner than Monday. Pt had to get off the phone with this RN but said she will call us back to be scheduled sooner.   Reason for Disposition  [1] Shingles rash (matches SYMPTOMS) AND [2] onset < 72 hours ago (3 days)  Answer Assessment - Initial Assessment Questions 1. APPEARANCE of RASH: "Describe the rash."      Back of the neck - red, bumpy, warm, feverish, no drainage 2. LOCATION: "Where is the rash located?"      Back of the neck  3. NUMBER: "How many spots are there?"      3-4 spots 4. SIZE: "How big are the spots?" (Inches, centimeters or compare to size of a coin)      3-4 spots the size of a thumbnail 5. ONSET: "When did the rash start?"      Wednesday 6. ITCHING: "Does the rash itch?" If Yes, ask: "How  bad is the itch?"  (Scale 0-10; or none, mild, moderate, severe)     5-6/10 7. PAIN: "Does the rash hurt?" If Yes, ask: "How bad is the pain?"  (Scale 0-10; or none, mild, moderate, severe)    - NONE (0): no pain    - MILD (1-3): doesn't interfere with normal activities     - MODERATE (4-7): interferes with normal activities or awakens from sleep     - SEVERE (8-10): excruciating pain, unable to do any normal activities     5/10 8. OTHER SYMPTOMS: "Do you have any other symptoms?" (e.g., fever)     Coworker positive for shingles, pt also had shingles 5-6 years ago. Pt states this rash feels similar but last time it was "more drastic." No fever. Endorses body aches and fatigue. No N/V/D.  Answer Assessment - Initial Assessment Questions 1. APPEARANCE of RASH: "Describe the rash."      3-4 red thumbnail sized spots back of neck 2. LOCATION: "Where is the rash located?"      Back of neck 3. ONSET: "When did the rash start?"      Wednesday 4. ITCHING: "Does the rash itch?" If Yes, ask: "How bad is the itch?"  (Scale 1-10; or mild, moderate, severe)     5/10 5. PAIN: "Does the rash hurt?" If Yes, ask: "How bad is  the pain?"  (Scale 0-10; or none, mild, moderate, severe)    - NONE (0): no pain    - MILD (1-3): doesn't interfere with normal activities     - MODERATE (4-7): interferes with normal activities or awakens from sleep     - SEVERE (8-10): excruciating pain, unable to do any normal activities     5/10 6. OTHER SYMPTOMS: "Do you have any other symptoms?" (e.g., fever)     Body aches and fatigue  Protocols used: Rash or Redness - Localized-A-AH, Shingles (Zoster)-A-AH

## 2023-12-19 ENCOUNTER — Ambulatory Visit: Payer: BC Managed Care – PPO | Admitting: Physician Assistant

## 2024-02-23 ENCOUNTER — Encounter: Payer: Self-pay | Admitting: Family Medicine

## 2024-06-19 ENCOUNTER — Encounter: Payer: Self-pay | Admitting: Sports Medicine
